# Patient Record
Sex: Female | Born: 1945 | Race: Black or African American | Hispanic: No | Marital: Single | Smoking: Never smoker
Health system: Southern US, Community
[De-identification: ages and names within clinical notes are randomized; demographics above are authoritative.]

## PROBLEM LIST (undated history)

## (undated) DIAGNOSIS — E119 Type 2 diabetes mellitus without complications: Secondary | ICD-10-CM

## (undated) DIAGNOSIS — H409 Unspecified glaucoma: Secondary | ICD-10-CM

## (undated) DIAGNOSIS — I10 Essential (primary) hypertension: Secondary | ICD-10-CM

## (undated) HISTORY — DX: Essential (primary) hypertension: I10

## (undated) HISTORY — DX: Type 2 diabetes mellitus without complications: E11.9

## (undated) HISTORY — DX: Unspecified glaucoma: H40.9

---

## 1998-12-24 ENCOUNTER — Inpatient Hospital Stay (HOSPITAL_COMMUNITY): Admission: EM | Admit: 1998-12-24 | Discharge: 1998-12-29 | Payer: Self-pay | Admitting: Emergency Medicine

## 1998-12-24 ENCOUNTER — Encounter: Payer: Self-pay | Admitting: Cardiology

## 2000-01-26 ENCOUNTER — Ambulatory Visit (HOSPITAL_COMMUNITY): Admission: RE | Admit: 2000-01-26 | Discharge: 2000-01-26 | Payer: Self-pay | Admitting: Cardiology

## 2000-01-26 ENCOUNTER — Encounter: Payer: Self-pay | Admitting: Cardiology

## 2000-02-08 ENCOUNTER — Encounter: Admission: RE | Admit: 2000-02-08 | Discharge: 2000-02-08 | Payer: Self-pay | Admitting: Cardiology

## 2000-02-08 ENCOUNTER — Encounter: Payer: Self-pay | Admitting: Cardiology

## 2001-02-13 ENCOUNTER — Encounter: Admission: RE | Admit: 2001-02-13 | Discharge: 2001-02-13 | Payer: Self-pay | Admitting: Cardiology

## 2001-02-13 ENCOUNTER — Encounter: Payer: Self-pay | Admitting: Cardiology

## 2002-01-09 ENCOUNTER — Emergency Department (HOSPITAL_COMMUNITY): Admission: EM | Admit: 2002-01-09 | Discharge: 2002-01-09 | Payer: Self-pay | Admitting: *Deleted

## 2002-02-16 ENCOUNTER — Encounter: Admission: RE | Admit: 2002-02-16 | Discharge: 2002-02-16 | Payer: Self-pay | Admitting: Cardiology

## 2002-02-16 ENCOUNTER — Encounter: Payer: Self-pay | Admitting: Cardiology

## 2002-05-13 ENCOUNTER — Ambulatory Visit (HOSPITAL_COMMUNITY): Admission: RE | Admit: 2002-05-13 | Discharge: 2002-05-13 | Payer: Self-pay | Admitting: Cardiology

## 2003-04-09 ENCOUNTER — Encounter: Admission: RE | Admit: 2003-04-09 | Discharge: 2003-04-09 | Payer: Self-pay | Admitting: Cardiology

## 2003-04-09 ENCOUNTER — Encounter: Payer: Self-pay | Admitting: Cardiology

## 2004-05-05 ENCOUNTER — Encounter: Admission: RE | Admit: 2004-05-05 | Discharge: 2004-05-05 | Payer: Self-pay | Admitting: Cardiology

## 2004-06-08 ENCOUNTER — Ambulatory Visit (HOSPITAL_COMMUNITY): Admission: RE | Admit: 2004-06-08 | Discharge: 2004-06-08 | Payer: Self-pay | Admitting: *Deleted

## 2004-06-08 ENCOUNTER — Encounter (INDEPENDENT_AMBULATORY_CARE_PROVIDER_SITE_OTHER): Payer: Self-pay | Admitting: Specialist

## 2005-04-12 ENCOUNTER — Ambulatory Visit (HOSPITAL_BASED_OUTPATIENT_CLINIC_OR_DEPARTMENT_OTHER): Admission: RE | Admit: 2005-04-12 | Discharge: 2005-04-12 | Payer: Self-pay | Admitting: Cardiology

## 2005-04-15 ENCOUNTER — Ambulatory Visit: Payer: Self-pay | Admitting: Internal Medicine

## 2005-05-10 ENCOUNTER — Encounter: Admission: RE | Admit: 2005-05-10 | Discharge: 2005-05-10 | Payer: Self-pay | Admitting: Cardiology

## 2006-05-13 ENCOUNTER — Encounter: Admission: RE | Admit: 2006-05-13 | Discharge: 2006-05-13 | Payer: Self-pay | Admitting: Cardiology

## 2007-06-18 ENCOUNTER — Encounter: Admission: RE | Admit: 2007-06-18 | Discharge: 2007-06-18 | Payer: Self-pay | Admitting: Cardiology

## 2008-06-18 ENCOUNTER — Encounter: Admission: RE | Admit: 2008-06-18 | Discharge: 2008-06-18 | Payer: Self-pay | Admitting: Cardiology

## 2009-06-20 ENCOUNTER — Encounter: Admission: RE | Admit: 2009-06-20 | Discharge: 2009-06-20 | Payer: Self-pay | Admitting: Cardiology

## 2010-04-10 ENCOUNTER — Encounter (HOSPITAL_BASED_OUTPATIENT_CLINIC_OR_DEPARTMENT_OTHER): Admission: RE | Admit: 2010-04-10 | Discharge: 2010-07-09 | Payer: Self-pay | Admitting: General Surgery

## 2010-04-12 ENCOUNTER — Ambulatory Visit (HOSPITAL_COMMUNITY): Admission: RE | Admit: 2010-04-12 | Discharge: 2010-04-12 | Payer: Self-pay | Admitting: General Surgery

## 2010-04-17 ENCOUNTER — Ambulatory Visit: Payer: Self-pay | Admitting: Vascular Surgery

## 2010-06-22 ENCOUNTER — Encounter: Admission: RE | Admit: 2010-06-22 | Discharge: 2010-06-22 | Payer: Self-pay | Admitting: Cardiology

## 2010-07-12 ENCOUNTER — Encounter (HOSPITAL_BASED_OUTPATIENT_CLINIC_OR_DEPARTMENT_OTHER)
Admission: RE | Admit: 2010-07-12 | Discharge: 2010-07-21 | Payer: Self-pay | Source: Home / Self Care | Admitting: General Surgery

## 2011-01-02 NOTE — Procedures (Signed)
DUPLEX DEEP VENOUS EXAM - LOWER EXTREMITY   INDICATION:  Questionable venous insufficiency due to ulcers.   HISTORY:  Edema:  Yes.  Trauma/Surgery:  Ulcers on bilateral ankles.  Pain:  No.  PE:  No.  Previous DVT:  No.  Anticoagulants:  No.  Other:  No.   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  There is a perforator noted on the  right ankle level.    _____________________________  Di Kindle. Edilia Bo, M.D.   CB/MEDQ  D:  04/17/2010  T:  04/17/2010  Job:  [id number]

## 2011-01-05 NOTE — Procedures (Signed)
NAME:  Lori Aguirre, Lori Aguirre               ACCOUNT NO.:  [id number]   MEDICAL RECORD NO.:  [id number]          PATIENT TYPE:  OUT   LOCATION:  SLEEP CENTER                 FACILITY:  [facility]   PHYSICIAN:  Clinton D. Maple Hudson, M.D. DATE OF BIRTH:  August 17, 1946   DATE OF STUDY:  04/12/2005                              NOCTURNAL POLYSOMNOGRAM   DATE OF STUDY:  April 12, 2005.   REFERRING PHYSICIAN:  Dr. Donia Guiles.   INDICATION FOR STUDY:  Hypersomnia with sleep apnea. Epworth sleepiness  score 13/24, BMI of 42, weight 257 pounds.   SLEEP ARCHITECTURE:  Short total sleep time 267 minutes with sleep  efficiency 67%. Stage I was 6%, stage II 58%, stages III and IV 27%, REM 10%  of total sleep time. Sleep latency 2 minutes, REM latency 295 minutes, awake  after sleep onset 115 minutes, arousal index 53 which is increased. No  bedtime medication reported.   RESPIRATORY DATA:  Split study protocol. Apnea/hypopnea index (AHI, RDI) 55  obstructive events per hour indicating severe obstructive sleep  apnea/hypopnea syndrome before C-PAP. This included 19 obstructive apneas  and 124 hypopneas before C-PAP. Events were not positional. REM AHI 56. C-  PAP was titrated to 12 CWP, AHI 12.6 per hour before the technician ran out  of time. A ResMed Ultra Mirage full face mask was used with heated  humidifier. Size not specified.   OXYGEN DATA:  Moderate snoring with oxygen desaturation to a nadir of 84%.  After C-PAP control, oxygen saturation held 94-98% on room air.   CARDIAC DATA:  Normal sinus rhythm.   MOVEMENT/PARASOMNIA:  Rare leg jerk. Awoke in the morning with incontinence  and complaint of leg cramp.   IMPRESSION/RECOMMENDATIONS:  1.  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  2.  C-PAP was titrated to 12 centimeter of water pressure, apnea/hypopnea      index 12.6 per hour using a  ResMed      Ultra Mirage full face mask with heated humidifier. Suggest home trial      at 13 or 14 centimeter of water pressure because residual events at 12      centimeter of water pressure.      Clinton D. Maple Hudson, M.D.  Diplomate, Biomedical engineer of Sleep Medicine  Electronically Signed     CDY/MEDQ  D:  04/15/2005 13:59:24  T:  04/16/2005 00:11:22  Job:  [id number]

## 2011-06-19 ENCOUNTER — Other Ambulatory Visit: Payer: Self-pay | Admitting: Cardiology

## 2011-06-19 DIAGNOSIS — Z1231 Encounter for screening mammogram for malignant neoplasm of breast: Secondary | ICD-10-CM

## 2011-07-13 ENCOUNTER — Ambulatory Visit: Payer: Self-pay

## 2011-07-18 ENCOUNTER — Ambulatory Visit
Admission: RE | Admit: 2011-07-18 | Discharge: 2011-07-18 | Disposition: A | Discharge status: Home / Self Care | Payer: Medicare Other | Source: Ambulatory Visit | Attending: Cardiology | Admitting: Cardiology

## 2011-07-18 DIAGNOSIS — Z1231 Encounter for screening mammogram for malignant neoplasm of breast: Secondary | ICD-10-CM

## 2012-07-01 ENCOUNTER — Other Ambulatory Visit: Payer: Self-pay | Admitting: Cardiology

## 2012-07-01 DIAGNOSIS — Z1231 Encounter for screening mammogram for malignant neoplasm of breast: Secondary | ICD-10-CM

## 2012-08-06 ENCOUNTER — Ambulatory Visit: Payer: Medicare Other

## 2015-05-24 ENCOUNTER — Ambulatory Visit
Admission: RE | Admit: 2015-05-24 | Discharge: 2015-05-24 | Disposition: A | Discharge status: Home / Self Care | Payer: Medicare Other | Source: Ambulatory Visit | Attending: Cardiology | Admitting: Cardiology

## 2015-05-24 ENCOUNTER — Other Ambulatory Visit: Payer: Self-pay | Admitting: Cardiology

## 2015-05-24 DIAGNOSIS — Z1231 Encounter for screening mammogram for malignant neoplasm of breast: Secondary | ICD-10-CM

## 2015-05-27 ENCOUNTER — Other Ambulatory Visit: Payer: Self-pay | Admitting: Cardiology

## 2015-05-27 DIAGNOSIS — R928 Other abnormal and inconclusive findings on diagnostic imaging of breast: Secondary | ICD-10-CM

## 2015-05-31 ENCOUNTER — Ambulatory Visit
Admission: RE | Admit: 2015-05-31 | Discharge: 2015-05-31 | Disposition: A | Discharge status: Home / Self Care | Payer: Medicare Other | Source: Ambulatory Visit | Attending: Cardiology | Admitting: Cardiology

## 2015-05-31 DIAGNOSIS — R928 Other abnormal and inconclusive findings on diagnostic imaging of breast: Secondary | ICD-10-CM

## 2016-09-14 ENCOUNTER — Other Ambulatory Visit: Payer: Self-pay | Admitting: Internal Medicine

## 2016-09-14 ENCOUNTER — Encounter (HOSPITAL_BASED_OUTPATIENT_CLINIC_OR_DEPARTMENT_OTHER): Payer: Medicare Other | Attending: Internal Medicine

## 2016-09-14 ENCOUNTER — Ambulatory Visit (HOSPITAL_COMMUNITY)
Admission: RE | Admit: 2016-09-14 | Discharge: 2016-09-14 | Disposition: A | Discharge status: Home / Self Care | Payer: Medicare Other | Source: Ambulatory Visit | Attending: Internal Medicine | Admitting: Internal Medicine

## 2016-09-14 DIAGNOSIS — E1169 Type 2 diabetes mellitus with other specified complication: Principal | ICD-10-CM

## 2016-09-14 DIAGNOSIS — E11621 Type 2 diabetes mellitus with foot ulcer: Secondary | ICD-10-CM

## 2016-09-14 DIAGNOSIS — M869 Osteomyelitis, unspecified: Secondary | ICD-10-CM | POA: Diagnosis not present

## 2016-09-14 DIAGNOSIS — L97509 Non-pressure chronic ulcer of other part of unspecified foot with unspecified severity: Principal | ICD-10-CM

## 2016-09-19 ENCOUNTER — Ambulatory Visit (HOSPITAL_COMMUNITY)
Admission: RE | Admit: 2016-09-19 | Discharge: 2016-09-19 | Disposition: A | Discharge status: Home / Self Care | Payer: Medicare Other | Source: Ambulatory Visit | Attending: Vascular Surgery | Admitting: Vascular Surgery

## 2016-09-19 ENCOUNTER — Other Ambulatory Visit: Payer: Self-pay | Admitting: Internal Medicine

## 2016-09-19 DIAGNOSIS — R938 Abnormal findings on diagnostic imaging of other specified body structures: Secondary | ICD-10-CM | POA: Diagnosis not present

## 2016-09-19 DIAGNOSIS — L97911 Non-pressure chronic ulcer of unspecified part of right lower leg limited to breakdown of skin: Secondary | ICD-10-CM | POA: Diagnosis present

## 2016-09-19 DIAGNOSIS — E11621 Type 2 diabetes mellitus with foot ulcer: Secondary | ICD-10-CM | POA: Diagnosis not present

## 2016-09-19 DIAGNOSIS — E785 Hyperlipidemia, unspecified: Secondary | ICD-10-CM | POA: Diagnosis not present

## 2016-09-19 DIAGNOSIS — I10 Essential (primary) hypertension: Secondary | ICD-10-CM | POA: Diagnosis not present

## 2016-09-20 ENCOUNTER — Encounter (HOSPITAL_BASED_OUTPATIENT_CLINIC_OR_DEPARTMENT_OTHER): Payer: Medicare Other | Attending: Internal Medicine

## 2016-09-20 DIAGNOSIS — E11622 Type 2 diabetes mellitus with other skin ulcer: Secondary | ICD-10-CM | POA: Insufficient documentation

## 2016-09-20 DIAGNOSIS — I10 Essential (primary) hypertension: Secondary | ICD-10-CM | POA: Diagnosis not present

## 2016-09-20 DIAGNOSIS — G4730 Sleep apnea, unspecified: Secondary | ICD-10-CM | POA: Insufficient documentation

## 2016-09-20 DIAGNOSIS — I87321 Chronic venous hypertension (idiopathic) with inflammation of right lower extremity: Secondary | ICD-10-CM | POA: Insufficient documentation

## 2016-09-20 DIAGNOSIS — L97312 Non-pressure chronic ulcer of right ankle with fat layer exposed: Secondary | ICD-10-CM | POA: Insufficient documentation

## 2016-09-27 DIAGNOSIS — I87321 Chronic venous hypertension (idiopathic) with inflammation of right lower extremity: Secondary | ICD-10-CM | POA: Diagnosis not present

## 2016-10-04 DIAGNOSIS — I87321 Chronic venous hypertension (idiopathic) with inflammation of right lower extremity: Secondary | ICD-10-CM | POA: Diagnosis not present

## 2016-10-11 DIAGNOSIS — I87321 Chronic venous hypertension (idiopathic) with inflammation of right lower extremity: Secondary | ICD-10-CM | POA: Diagnosis not present

## 2016-10-18 ENCOUNTER — Encounter (HOSPITAL_BASED_OUTPATIENT_CLINIC_OR_DEPARTMENT_OTHER): Payer: Medicare Other | Attending: Internal Medicine

## 2016-10-18 DIAGNOSIS — G4730 Sleep apnea, unspecified: Secondary | ICD-10-CM | POA: Insufficient documentation

## 2016-10-18 DIAGNOSIS — L97312 Non-pressure chronic ulcer of right ankle with fat layer exposed: Secondary | ICD-10-CM | POA: Insufficient documentation

## 2016-10-18 DIAGNOSIS — I87321 Chronic venous hypertension (idiopathic) with inflammation of right lower extremity: Secondary | ICD-10-CM | POA: Diagnosis not present

## 2016-10-18 DIAGNOSIS — E11622 Type 2 diabetes mellitus with other skin ulcer: Secondary | ICD-10-CM | POA: Diagnosis not present

## 2016-10-18 DIAGNOSIS — L97812 Non-pressure chronic ulcer of other part of right lower leg with fat layer exposed: Secondary | ICD-10-CM | POA: Insufficient documentation

## 2016-10-18 DIAGNOSIS — I10 Essential (primary) hypertension: Secondary | ICD-10-CM | POA: Diagnosis not present

## 2016-10-19 DIAGNOSIS — E11622 Type 2 diabetes mellitus with other skin ulcer: Secondary | ICD-10-CM | POA: Diagnosis not present

## 2016-10-25 DIAGNOSIS — E11622 Type 2 diabetes mellitus with other skin ulcer: Secondary | ICD-10-CM | POA: Diagnosis not present

## 2016-11-01 DIAGNOSIS — E11622 Type 2 diabetes mellitus with other skin ulcer: Secondary | ICD-10-CM | POA: Diagnosis not present

## 2016-11-08 DIAGNOSIS — E11622 Type 2 diabetes mellitus with other skin ulcer: Secondary | ICD-10-CM | POA: Diagnosis not present

## 2016-11-15 DIAGNOSIS — E11622 Type 2 diabetes mellitus with other skin ulcer: Secondary | ICD-10-CM | POA: Diagnosis not present

## 2016-11-22 ENCOUNTER — Encounter (HOSPITAL_BASED_OUTPATIENT_CLINIC_OR_DEPARTMENT_OTHER): Payer: Medicare Other | Attending: Internal Medicine

## 2016-11-22 DIAGNOSIS — I87321 Chronic venous hypertension (idiopathic) with inflammation of right lower extremity: Secondary | ICD-10-CM | POA: Insufficient documentation

## 2016-11-22 DIAGNOSIS — E11622 Type 2 diabetes mellitus with other skin ulcer: Secondary | ICD-10-CM | POA: Diagnosis present

## 2016-11-22 DIAGNOSIS — L97313 Non-pressure chronic ulcer of right ankle with necrosis of muscle: Secondary | ICD-10-CM | POA: Insufficient documentation

## 2016-11-22 DIAGNOSIS — I10 Essential (primary) hypertension: Secondary | ICD-10-CM | POA: Diagnosis not present

## 2016-11-22 DIAGNOSIS — H269 Unspecified cataract: Secondary | ICD-10-CM | POA: Diagnosis not present

## 2016-11-22 DIAGNOSIS — G4730 Sleep apnea, unspecified: Secondary | ICD-10-CM | POA: Insufficient documentation

## 2016-11-29 DIAGNOSIS — E11622 Type 2 diabetes mellitus with other skin ulcer: Secondary | ICD-10-CM | POA: Diagnosis not present

## 2016-12-06 DIAGNOSIS — E11622 Type 2 diabetes mellitus with other skin ulcer: Secondary | ICD-10-CM | POA: Diagnosis not present

## 2016-12-13 DIAGNOSIS — E11622 Type 2 diabetes mellitus with other skin ulcer: Secondary | ICD-10-CM | POA: Diagnosis not present

## 2016-12-20 ENCOUNTER — Encounter (HOSPITAL_BASED_OUTPATIENT_CLINIC_OR_DEPARTMENT_OTHER): Payer: Medicare Other | Attending: Internal Medicine

## 2016-12-20 DIAGNOSIS — I10 Essential (primary) hypertension: Secondary | ICD-10-CM | POA: Diagnosis not present

## 2016-12-20 DIAGNOSIS — I87321 Chronic venous hypertension (idiopathic) with inflammation of right lower extremity: Secondary | ICD-10-CM | POA: Insufficient documentation

## 2016-12-20 DIAGNOSIS — G4730 Sleep apnea, unspecified: Secondary | ICD-10-CM | POA: Insufficient documentation

## 2016-12-20 DIAGNOSIS — L97313 Non-pressure chronic ulcer of right ankle with necrosis of muscle: Secondary | ICD-10-CM | POA: Insufficient documentation

## 2016-12-20 DIAGNOSIS — E11622 Type 2 diabetes mellitus with other skin ulcer: Secondary | ICD-10-CM | POA: Insufficient documentation

## 2016-12-27 DIAGNOSIS — E11622 Type 2 diabetes mellitus with other skin ulcer: Secondary | ICD-10-CM | POA: Diagnosis not present

## 2017-01-03 ENCOUNTER — Other Ambulatory Visit: Payer: Self-pay | Admitting: Internal Medicine

## 2017-01-03 DIAGNOSIS — E11622 Type 2 diabetes mellitus with other skin ulcer: Secondary | ICD-10-CM | POA: Diagnosis not present

## 2017-01-10 DIAGNOSIS — E11622 Type 2 diabetes mellitus with other skin ulcer: Secondary | ICD-10-CM | POA: Diagnosis not present

## 2017-01-17 DIAGNOSIS — E11622 Type 2 diabetes mellitus with other skin ulcer: Secondary | ICD-10-CM | POA: Diagnosis not present

## 2017-01-22 ENCOUNTER — Other Ambulatory Visit: Payer: Self-pay | Admitting: Internal Medicine

## 2017-01-22 ENCOUNTER — Ambulatory Visit (HOSPITAL_COMMUNITY)
Admission: RE | Admit: 2017-01-22 | Discharge: 2017-01-22 | Disposition: A | Discharge status: Home / Self Care | Payer: Medicare Other | Source: Ambulatory Visit | Attending: Vascular Surgery | Admitting: Vascular Surgery

## 2017-01-22 DIAGNOSIS — L97313 Non-pressure chronic ulcer of right ankle with necrosis of muscle: Secondary | ICD-10-CM

## 2017-01-24 ENCOUNTER — Encounter (HOSPITAL_BASED_OUTPATIENT_CLINIC_OR_DEPARTMENT_OTHER): Payer: Medicare Other | Attending: Internal Medicine

## 2017-01-24 DIAGNOSIS — G4730 Sleep apnea, unspecified: Secondary | ICD-10-CM | POA: Insufficient documentation

## 2017-01-24 DIAGNOSIS — E11622 Type 2 diabetes mellitus with other skin ulcer: Secondary | ICD-10-CM | POA: Diagnosis not present

## 2017-01-24 DIAGNOSIS — I10 Essential (primary) hypertension: Secondary | ICD-10-CM | POA: Insufficient documentation

## 2017-01-24 DIAGNOSIS — I87321 Chronic venous hypertension (idiopathic) with inflammation of right lower extremity: Secondary | ICD-10-CM | POA: Diagnosis not present

## 2017-01-24 DIAGNOSIS — L97312 Non-pressure chronic ulcer of right ankle with fat layer exposed: Secondary | ICD-10-CM | POA: Diagnosis not present

## 2017-01-31 DIAGNOSIS — I87321 Chronic venous hypertension (idiopathic) with inflammation of right lower extremity: Secondary | ICD-10-CM | POA: Diagnosis not present

## 2017-02-07 DIAGNOSIS — I87321 Chronic venous hypertension (idiopathic) with inflammation of right lower extremity: Secondary | ICD-10-CM | POA: Diagnosis not present

## 2017-02-14 DIAGNOSIS — I87321 Chronic venous hypertension (idiopathic) with inflammation of right lower extremity: Secondary | ICD-10-CM | POA: Diagnosis not present

## 2017-02-21 ENCOUNTER — Encounter (HOSPITAL_BASED_OUTPATIENT_CLINIC_OR_DEPARTMENT_OTHER): Payer: Medicare Other | Attending: Internal Medicine

## 2017-02-21 DIAGNOSIS — I87331 Chronic venous hypertension (idiopathic) with ulcer and inflammation of right lower extremity: Secondary | ICD-10-CM | POA: Insufficient documentation

## 2017-02-21 DIAGNOSIS — L97312 Non-pressure chronic ulcer of right ankle with fat layer exposed: Secondary | ICD-10-CM | POA: Insufficient documentation

## 2017-02-21 DIAGNOSIS — E11622 Type 2 diabetes mellitus with other skin ulcer: Secondary | ICD-10-CM | POA: Diagnosis not present

## 2017-02-21 DIAGNOSIS — I10 Essential (primary) hypertension: Secondary | ICD-10-CM | POA: Insufficient documentation

## 2017-02-21 DIAGNOSIS — G4730 Sleep apnea, unspecified: Secondary | ICD-10-CM | POA: Diagnosis not present

## 2017-02-28 DIAGNOSIS — E11622 Type 2 diabetes mellitus with other skin ulcer: Secondary | ICD-10-CM | POA: Diagnosis not present

## 2017-03-07 ENCOUNTER — Ambulatory Visit (HOSPITAL_COMMUNITY)
Admission: RE | Admit: 2017-03-07 | Discharge: 2017-03-07 | Disposition: A | Discharge status: Home / Self Care | Payer: Medicare Other | Source: Ambulatory Visit | Attending: Internal Medicine | Admitting: Internal Medicine

## 2017-03-07 ENCOUNTER — Other Ambulatory Visit: Payer: Self-pay | Admitting: Internal Medicine

## 2017-03-07 DIAGNOSIS — M869 Osteomyelitis, unspecified: Secondary | ICD-10-CM

## 2017-03-07 DIAGNOSIS — E11622 Type 2 diabetes mellitus with other skin ulcer: Secondary | ICD-10-CM | POA: Insufficient documentation

## 2017-03-12 ENCOUNTER — Other Ambulatory Visit: Payer: Self-pay | Admitting: Internal Medicine

## 2017-03-12 DIAGNOSIS — L97313 Non-pressure chronic ulcer of right ankle with necrosis of muscle: Secondary | ICD-10-CM

## 2017-03-14 DIAGNOSIS — E11622 Type 2 diabetes mellitus with other skin ulcer: Secondary | ICD-10-CM | POA: Diagnosis not present

## 2017-03-19 ENCOUNTER — Ambulatory Visit (HOSPITAL_COMMUNITY)
Admission: RE | Admit: 2017-03-19 | Discharge: 2017-03-19 | Disposition: A | Discharge status: Home / Self Care | Payer: Medicare Other | Source: Ambulatory Visit | Attending: Internal Medicine | Admitting: Internal Medicine

## 2017-03-19 DIAGNOSIS — L97313 Non-pressure chronic ulcer of right ankle with necrosis of muscle: Secondary | ICD-10-CM | POA: Insufficient documentation

## 2017-03-19 LAB — POCT I-STAT CREATININE: CREATININE: 1 mg/dL (ref 0.44–1.00)

## 2017-03-19 MED ORDER — GADOBENATE DIMEGLUMINE 529 MG/ML IV SOLN
20.0000 mL | Freq: Once | INTRAVENOUS | Status: AC | PRN
  Administered 2017-03-19: 20 mL via INTRAVENOUS

## 2017-03-21 ENCOUNTER — Encounter (HOSPITAL_BASED_OUTPATIENT_CLINIC_OR_DEPARTMENT_OTHER): Payer: Medicare Other | Attending: Internal Medicine

## 2017-03-21 DIAGNOSIS — L97312 Non-pressure chronic ulcer of right ankle with fat layer exposed: Secondary | ICD-10-CM | POA: Insufficient documentation

## 2017-03-21 DIAGNOSIS — E11622 Type 2 diabetes mellitus with other skin ulcer: Secondary | ICD-10-CM | POA: Diagnosis present

## 2017-03-21 DIAGNOSIS — I10 Essential (primary) hypertension: Secondary | ICD-10-CM | POA: Insufficient documentation

## 2017-03-21 DIAGNOSIS — I87321 Chronic venous hypertension (idiopathic) with inflammation of right lower extremity: Secondary | ICD-10-CM | POA: Diagnosis not present

## 2017-03-21 DIAGNOSIS — G4730 Sleep apnea, unspecified: Secondary | ICD-10-CM | POA: Diagnosis not present

## 2017-03-28 DIAGNOSIS — E11622 Type 2 diabetes mellitus with other skin ulcer: Secondary | ICD-10-CM | POA: Diagnosis not present

## 2017-04-04 DIAGNOSIS — E11622 Type 2 diabetes mellitus with other skin ulcer: Secondary | ICD-10-CM | POA: Diagnosis not present

## 2017-04-11 DIAGNOSIS — E11622 Type 2 diabetes mellitus with other skin ulcer: Secondary | ICD-10-CM | POA: Diagnosis not present

## 2017-04-18 DIAGNOSIS — E11622 Type 2 diabetes mellitus with other skin ulcer: Secondary | ICD-10-CM | POA: Diagnosis not present

## 2017-04-25 ENCOUNTER — Encounter (HOSPITAL_BASED_OUTPATIENT_CLINIC_OR_DEPARTMENT_OTHER): Payer: Medicare Other | Attending: Internal Medicine

## 2017-04-25 DIAGNOSIS — E11622 Type 2 diabetes mellitus with other skin ulcer: Secondary | ICD-10-CM | POA: Insufficient documentation

## 2017-04-25 DIAGNOSIS — L97313 Non-pressure chronic ulcer of right ankle with necrosis of muscle: Secondary | ICD-10-CM | POA: Diagnosis not present

## 2017-04-25 DIAGNOSIS — I10 Essential (primary) hypertension: Secondary | ICD-10-CM | POA: Diagnosis not present

## 2017-04-25 DIAGNOSIS — G4730 Sleep apnea, unspecified: Secondary | ICD-10-CM | POA: Diagnosis not present

## 2017-04-25 DIAGNOSIS — I87331 Chronic venous hypertension (idiopathic) with ulcer and inflammation of right lower extremity: Secondary | ICD-10-CM | POA: Insufficient documentation

## 2017-05-02 DIAGNOSIS — E11622 Type 2 diabetes mellitus with other skin ulcer: Secondary | ICD-10-CM | POA: Diagnosis not present

## 2017-05-09 DIAGNOSIS — E11622 Type 2 diabetes mellitus with other skin ulcer: Secondary | ICD-10-CM | POA: Diagnosis not present

## 2017-05-16 DIAGNOSIS — E11622 Type 2 diabetes mellitus with other skin ulcer: Secondary | ICD-10-CM | POA: Diagnosis not present

## 2017-05-30 ENCOUNTER — Encounter (HOSPITAL_BASED_OUTPATIENT_CLINIC_OR_DEPARTMENT_OTHER): Payer: Medicare Other | Attending: Internal Medicine

## 2017-05-30 DIAGNOSIS — I10 Essential (primary) hypertension: Secondary | ICD-10-CM | POA: Insufficient documentation

## 2017-05-30 DIAGNOSIS — G4730 Sleep apnea, unspecified: Secondary | ICD-10-CM | POA: Insufficient documentation

## 2017-05-30 DIAGNOSIS — I87321 Chronic venous hypertension (idiopathic) with inflammation of right lower extremity: Secondary | ICD-10-CM | POA: Insufficient documentation

## 2017-05-30 DIAGNOSIS — L97312 Non-pressure chronic ulcer of right ankle with fat layer exposed: Secondary | ICD-10-CM | POA: Insufficient documentation

## 2017-05-30 DIAGNOSIS — E11622 Type 2 diabetes mellitus with other skin ulcer: Secondary | ICD-10-CM | POA: Insufficient documentation

## 2017-05-31 DIAGNOSIS — I10 Essential (primary) hypertension: Secondary | ICD-10-CM | POA: Diagnosis not present

## 2017-05-31 DIAGNOSIS — G4730 Sleep apnea, unspecified: Secondary | ICD-10-CM | POA: Diagnosis not present

## 2017-05-31 DIAGNOSIS — L97312 Non-pressure chronic ulcer of right ankle with fat layer exposed: Secondary | ICD-10-CM | POA: Diagnosis not present

## 2017-05-31 DIAGNOSIS — E11622 Type 2 diabetes mellitus with other skin ulcer: Secondary | ICD-10-CM | POA: Diagnosis not present

## 2017-05-31 DIAGNOSIS — I87321 Chronic venous hypertension (idiopathic) with inflammation of right lower extremity: Secondary | ICD-10-CM | POA: Diagnosis not present

## 2017-06-06 DIAGNOSIS — E11622 Type 2 diabetes mellitus with other skin ulcer: Secondary | ICD-10-CM | POA: Diagnosis not present

## 2017-06-13 DIAGNOSIS — E11622 Type 2 diabetes mellitus with other skin ulcer: Secondary | ICD-10-CM | POA: Diagnosis not present

## 2017-06-20 ENCOUNTER — Encounter (HOSPITAL_BASED_OUTPATIENT_CLINIC_OR_DEPARTMENT_OTHER): Payer: Medicare Other | Attending: Internal Medicine

## 2017-06-20 DIAGNOSIS — L97312 Non-pressure chronic ulcer of right ankle with fat layer exposed: Secondary | ICD-10-CM | POA: Diagnosis not present

## 2017-06-20 DIAGNOSIS — G4730 Sleep apnea, unspecified: Secondary | ICD-10-CM | POA: Insufficient documentation

## 2017-06-20 DIAGNOSIS — I10 Essential (primary) hypertension: Secondary | ICD-10-CM | POA: Diagnosis not present

## 2017-06-20 DIAGNOSIS — E11622 Type 2 diabetes mellitus with other skin ulcer: Secondary | ICD-10-CM | POA: Diagnosis not present

## 2017-06-20 DIAGNOSIS — I87321 Chronic venous hypertension (idiopathic) with inflammation of right lower extremity: Secondary | ICD-10-CM | POA: Insufficient documentation

## 2017-06-27 DIAGNOSIS — I87321 Chronic venous hypertension (idiopathic) with inflammation of right lower extremity: Secondary | ICD-10-CM | POA: Diagnosis not present

## 2017-07-03 ENCOUNTER — Ambulatory Visit
Admission: RE | Admit: 2017-07-03 | Discharge: 2017-07-03 | Disposition: A | Discharge status: Home / Self Care | Payer: Medicare Other | Source: Ambulatory Visit | Attending: Cardiology | Admitting: Cardiology

## 2017-07-03 ENCOUNTER — Other Ambulatory Visit: Payer: Self-pay | Admitting: Cardiology

## 2017-07-03 DIAGNOSIS — Z1231 Encounter for screening mammogram for malignant neoplasm of breast: Secondary | ICD-10-CM

## 2017-07-04 DIAGNOSIS — I87321 Chronic venous hypertension (idiopathic) with inflammation of right lower extremity: Secondary | ICD-10-CM | POA: Diagnosis not present

## 2017-07-18 DIAGNOSIS — I87321 Chronic venous hypertension (idiopathic) with inflammation of right lower extremity: Secondary | ICD-10-CM | POA: Diagnosis not present

## 2017-07-25 ENCOUNTER — Encounter (HOSPITAL_BASED_OUTPATIENT_CLINIC_OR_DEPARTMENT_OTHER): Payer: Medicare Other | Attending: Internal Medicine

## 2017-07-25 DIAGNOSIS — I87321 Chronic venous hypertension (idiopathic) with inflammation of right lower extremity: Secondary | ICD-10-CM | POA: Insufficient documentation

## 2017-07-25 DIAGNOSIS — G4730 Sleep apnea, unspecified: Secondary | ICD-10-CM | POA: Diagnosis not present

## 2017-07-25 DIAGNOSIS — L97312 Non-pressure chronic ulcer of right ankle with fat layer exposed: Secondary | ICD-10-CM | POA: Insufficient documentation

## 2017-07-25 DIAGNOSIS — I10 Essential (primary) hypertension: Secondary | ICD-10-CM | POA: Insufficient documentation

## 2017-07-25 DIAGNOSIS — E1136 Type 2 diabetes mellitus with diabetic cataract: Secondary | ICD-10-CM | POA: Insufficient documentation

## 2017-07-25 DIAGNOSIS — E11622 Type 2 diabetes mellitus with other skin ulcer: Secondary | ICD-10-CM | POA: Insufficient documentation

## 2017-08-01 DIAGNOSIS — E11622 Type 2 diabetes mellitus with other skin ulcer: Secondary | ICD-10-CM | POA: Diagnosis not present

## 2017-08-08 DIAGNOSIS — E11622 Type 2 diabetes mellitus with other skin ulcer: Secondary | ICD-10-CM | POA: Diagnosis not present

## 2017-08-15 DIAGNOSIS — E11622 Type 2 diabetes mellitus with other skin ulcer: Secondary | ICD-10-CM | POA: Diagnosis not present

## 2017-08-22 ENCOUNTER — Encounter (HOSPITAL_BASED_OUTPATIENT_CLINIC_OR_DEPARTMENT_OTHER): Payer: Medicare Other | Attending: Internal Medicine

## 2017-08-22 DIAGNOSIS — I87331 Chronic venous hypertension (idiopathic) with ulcer and inflammation of right lower extremity: Secondary | ICD-10-CM | POA: Insufficient documentation

## 2017-08-22 DIAGNOSIS — E11622 Type 2 diabetes mellitus with other skin ulcer: Secondary | ICD-10-CM | POA: Insufficient documentation

## 2017-08-22 DIAGNOSIS — L97312 Non-pressure chronic ulcer of right ankle with fat layer exposed: Secondary | ICD-10-CM | POA: Insufficient documentation

## 2017-08-30 DIAGNOSIS — E11622 Type 2 diabetes mellitus with other skin ulcer: Secondary | ICD-10-CM | POA: Diagnosis not present

## 2017-09-12 DIAGNOSIS — E11622 Type 2 diabetes mellitus with other skin ulcer: Secondary | ICD-10-CM | POA: Diagnosis not present

## 2017-09-26 ENCOUNTER — Encounter (HOSPITAL_BASED_OUTPATIENT_CLINIC_OR_DEPARTMENT_OTHER): Payer: Medicare Other | Attending: Internal Medicine

## 2017-09-26 DIAGNOSIS — I10 Essential (primary) hypertension: Secondary | ICD-10-CM | POA: Diagnosis not present

## 2017-09-26 DIAGNOSIS — L97312 Non-pressure chronic ulcer of right ankle with fat layer exposed: Secondary | ICD-10-CM | POA: Insufficient documentation

## 2017-09-26 DIAGNOSIS — E11622 Type 2 diabetes mellitus with other skin ulcer: Secondary | ICD-10-CM | POA: Diagnosis not present

## 2017-09-26 DIAGNOSIS — I87321 Chronic venous hypertension (idiopathic) with inflammation of right lower extremity: Secondary | ICD-10-CM | POA: Diagnosis not present

## 2017-09-26 DIAGNOSIS — G4730 Sleep apnea, unspecified: Secondary | ICD-10-CM | POA: Insufficient documentation

## 2017-10-10 DIAGNOSIS — E11622 Type 2 diabetes mellitus with other skin ulcer: Secondary | ICD-10-CM | POA: Diagnosis not present

## 2017-10-24 ENCOUNTER — Encounter (HOSPITAL_BASED_OUTPATIENT_CLINIC_OR_DEPARTMENT_OTHER): Payer: Medicare Other | Attending: Internal Medicine

## 2017-10-24 DIAGNOSIS — I10 Essential (primary) hypertension: Secondary | ICD-10-CM | POA: Insufficient documentation

## 2017-10-24 DIAGNOSIS — G4730 Sleep apnea, unspecified: Secondary | ICD-10-CM | POA: Diagnosis not present

## 2017-10-24 DIAGNOSIS — E11622 Type 2 diabetes mellitus with other skin ulcer: Secondary | ICD-10-CM | POA: Insufficient documentation

## 2017-10-24 DIAGNOSIS — L97312 Non-pressure chronic ulcer of right ankle with fat layer exposed: Secondary | ICD-10-CM | POA: Diagnosis not present

## 2017-10-24 DIAGNOSIS — I87321 Chronic venous hypertension (idiopathic) with inflammation of right lower extremity: Secondary | ICD-10-CM | POA: Insufficient documentation

## 2017-10-24 DIAGNOSIS — E1136 Type 2 diabetes mellitus with diabetic cataract: Secondary | ICD-10-CM | POA: Diagnosis not present

## 2017-11-07 DIAGNOSIS — E11622 Type 2 diabetes mellitus with other skin ulcer: Secondary | ICD-10-CM | POA: Diagnosis not present

## 2017-11-21 ENCOUNTER — Encounter (HOSPITAL_BASED_OUTPATIENT_CLINIC_OR_DEPARTMENT_OTHER): Payer: Medicare Other | Attending: Internal Medicine

## 2017-11-21 DIAGNOSIS — G4730 Sleep apnea, unspecified: Secondary | ICD-10-CM | POA: Insufficient documentation

## 2017-11-21 DIAGNOSIS — E11622 Type 2 diabetes mellitus with other skin ulcer: Secondary | ICD-10-CM | POA: Insufficient documentation

## 2017-11-21 DIAGNOSIS — I87321 Chronic venous hypertension (idiopathic) with inflammation of right lower extremity: Secondary | ICD-10-CM | POA: Insufficient documentation

## 2017-11-21 DIAGNOSIS — L97312 Non-pressure chronic ulcer of right ankle with fat layer exposed: Secondary | ICD-10-CM | POA: Diagnosis not present

## 2017-11-21 DIAGNOSIS — I10 Essential (primary) hypertension: Secondary | ICD-10-CM | POA: Insufficient documentation

## 2017-12-05 DIAGNOSIS — E11622 Type 2 diabetes mellitus with other skin ulcer: Secondary | ICD-10-CM | POA: Diagnosis not present

## 2017-12-19 ENCOUNTER — Encounter (HOSPITAL_BASED_OUTPATIENT_CLINIC_OR_DEPARTMENT_OTHER): Payer: Medicare Other | Attending: Internal Medicine

## 2017-12-19 DIAGNOSIS — E11622 Type 2 diabetes mellitus with other skin ulcer: Secondary | ICD-10-CM | POA: Diagnosis not present

## 2017-12-19 DIAGNOSIS — I10 Essential (primary) hypertension: Secondary | ICD-10-CM | POA: Diagnosis not present

## 2017-12-19 DIAGNOSIS — G4730 Sleep apnea, unspecified: Secondary | ICD-10-CM | POA: Insufficient documentation

## 2017-12-19 DIAGNOSIS — L97312 Non-pressure chronic ulcer of right ankle with fat layer exposed: Secondary | ICD-10-CM | POA: Diagnosis not present

## 2017-12-19 DIAGNOSIS — I87321 Chronic venous hypertension (idiopathic) with inflammation of right lower extremity: Secondary | ICD-10-CM | POA: Insufficient documentation

## 2018-01-02 DIAGNOSIS — E11622 Type 2 diabetes mellitus with other skin ulcer: Secondary | ICD-10-CM | POA: Diagnosis not present

## 2018-01-16 DIAGNOSIS — E11622 Type 2 diabetes mellitus with other skin ulcer: Secondary | ICD-10-CM | POA: Diagnosis not present

## 2018-01-30 ENCOUNTER — Encounter (HOSPITAL_BASED_OUTPATIENT_CLINIC_OR_DEPARTMENT_OTHER): Payer: Medicare Other | Attending: Internal Medicine

## 2018-01-30 DIAGNOSIS — E11622 Type 2 diabetes mellitus with other skin ulcer: Secondary | ICD-10-CM | POA: Insufficient documentation

## 2018-01-30 DIAGNOSIS — I87331 Chronic venous hypertension (idiopathic) with ulcer and inflammation of right lower extremity: Secondary | ICD-10-CM | POA: Insufficient documentation

## 2018-01-30 DIAGNOSIS — G4730 Sleep apnea, unspecified: Secondary | ICD-10-CM | POA: Diagnosis not present

## 2018-01-30 DIAGNOSIS — I10 Essential (primary) hypertension: Secondary | ICD-10-CM | POA: Diagnosis not present

## 2018-01-30 DIAGNOSIS — L97312 Non-pressure chronic ulcer of right ankle with fat layer exposed: Secondary | ICD-10-CM | POA: Diagnosis not present

## 2018-02-13 DIAGNOSIS — E11622 Type 2 diabetes mellitus with other skin ulcer: Secondary | ICD-10-CM | POA: Diagnosis not present

## 2018-02-27 ENCOUNTER — Encounter (HOSPITAL_BASED_OUTPATIENT_CLINIC_OR_DEPARTMENT_OTHER): Payer: Medicare Other | Attending: Internal Medicine

## 2018-02-27 DIAGNOSIS — I87331 Chronic venous hypertension (idiopathic) with ulcer and inflammation of right lower extremity: Secondary | ICD-10-CM | POA: Insufficient documentation

## 2018-02-27 DIAGNOSIS — I10 Essential (primary) hypertension: Secondary | ICD-10-CM | POA: Insufficient documentation

## 2018-02-27 DIAGNOSIS — E11622 Type 2 diabetes mellitus with other skin ulcer: Secondary | ICD-10-CM | POA: Insufficient documentation

## 2018-02-27 DIAGNOSIS — G4730 Sleep apnea, unspecified: Secondary | ICD-10-CM | POA: Diagnosis not present

## 2018-02-27 DIAGNOSIS — L97412 Non-pressure chronic ulcer of right heel and midfoot with fat layer exposed: Secondary | ICD-10-CM | POA: Insufficient documentation

## 2018-03-20 ENCOUNTER — Encounter (HOSPITAL_BASED_OUTPATIENT_CLINIC_OR_DEPARTMENT_OTHER): Payer: Medicare Other | Attending: Internal Medicine

## 2018-03-20 DIAGNOSIS — E11622 Type 2 diabetes mellitus with other skin ulcer: Secondary | ICD-10-CM | POA: Diagnosis present

## 2018-03-20 DIAGNOSIS — I87331 Chronic venous hypertension (idiopathic) with ulcer and inflammation of right lower extremity: Secondary | ICD-10-CM | POA: Diagnosis not present

## 2018-03-20 DIAGNOSIS — L97312 Non-pressure chronic ulcer of right ankle with fat layer exposed: Secondary | ICD-10-CM | POA: Diagnosis not present

## 2018-04-10 DIAGNOSIS — E11622 Type 2 diabetes mellitus with other skin ulcer: Secondary | ICD-10-CM | POA: Diagnosis not present

## 2018-04-24 ENCOUNTER — Encounter (HOSPITAL_BASED_OUTPATIENT_CLINIC_OR_DEPARTMENT_OTHER): Payer: Medicare Other | Attending: Internal Medicine

## 2018-04-24 DIAGNOSIS — L97312 Non-pressure chronic ulcer of right ankle with fat layer exposed: Secondary | ICD-10-CM | POA: Diagnosis not present

## 2018-04-24 DIAGNOSIS — G4730 Sleep apnea, unspecified: Secondary | ICD-10-CM | POA: Diagnosis not present

## 2018-04-24 DIAGNOSIS — I10 Essential (primary) hypertension: Secondary | ICD-10-CM | POA: Diagnosis not present

## 2018-04-24 DIAGNOSIS — I87331 Chronic venous hypertension (idiopathic) with ulcer and inflammation of right lower extremity: Secondary | ICD-10-CM | POA: Diagnosis not present

## 2018-04-24 DIAGNOSIS — Z7984 Long term (current) use of oral hypoglycemic drugs: Secondary | ICD-10-CM | POA: Diagnosis not present

## 2018-04-24 DIAGNOSIS — E11622 Type 2 diabetes mellitus with other skin ulcer: Secondary | ICD-10-CM | POA: Insufficient documentation

## 2018-05-08 DIAGNOSIS — E11622 Type 2 diabetes mellitus with other skin ulcer: Secondary | ICD-10-CM | POA: Diagnosis not present

## 2018-05-22 ENCOUNTER — Encounter (HOSPITAL_BASED_OUTPATIENT_CLINIC_OR_DEPARTMENT_OTHER): Payer: Medicare Other | Attending: Internal Medicine

## 2018-05-22 DIAGNOSIS — G4730 Sleep apnea, unspecified: Secondary | ICD-10-CM | POA: Diagnosis not present

## 2018-05-22 DIAGNOSIS — E11622 Type 2 diabetes mellitus with other skin ulcer: Secondary | ICD-10-CM | POA: Insufficient documentation

## 2018-05-22 DIAGNOSIS — I10 Essential (primary) hypertension: Secondary | ICD-10-CM | POA: Insufficient documentation

## 2018-05-22 DIAGNOSIS — I87331 Chronic venous hypertension (idiopathic) with ulcer and inflammation of right lower extremity: Secondary | ICD-10-CM | POA: Insufficient documentation

## 2018-05-22 DIAGNOSIS — L97312 Non-pressure chronic ulcer of right ankle with fat layer exposed: Secondary | ICD-10-CM | POA: Insufficient documentation

## 2018-06-05 DIAGNOSIS — E11622 Type 2 diabetes mellitus with other skin ulcer: Secondary | ICD-10-CM | POA: Diagnosis not present

## 2018-06-19 DIAGNOSIS — E11622 Type 2 diabetes mellitus with other skin ulcer: Secondary | ICD-10-CM | POA: Diagnosis not present

## 2018-07-03 ENCOUNTER — Encounter (HOSPITAL_BASED_OUTPATIENT_CLINIC_OR_DEPARTMENT_OTHER): Payer: Medicare Other | Attending: Internal Medicine

## 2018-07-03 DIAGNOSIS — G4730 Sleep apnea, unspecified: Secondary | ICD-10-CM | POA: Insufficient documentation

## 2018-07-03 DIAGNOSIS — I10 Essential (primary) hypertension: Secondary | ICD-10-CM | POA: Insufficient documentation

## 2018-07-03 DIAGNOSIS — I87321 Chronic venous hypertension (idiopathic) with inflammation of right lower extremity: Secondary | ICD-10-CM | POA: Insufficient documentation

## 2018-07-03 DIAGNOSIS — E11621 Type 2 diabetes mellitus with foot ulcer: Secondary | ICD-10-CM | POA: Insufficient documentation

## 2018-07-03 DIAGNOSIS — L97312 Non-pressure chronic ulcer of right ankle with fat layer exposed: Secondary | ICD-10-CM | POA: Insufficient documentation

## 2018-07-24 ENCOUNTER — Encounter (HOSPITAL_BASED_OUTPATIENT_CLINIC_OR_DEPARTMENT_OTHER): Payer: Medicare Other | Attending: Internal Medicine

## 2018-07-24 DIAGNOSIS — G4730 Sleep apnea, unspecified: Secondary | ICD-10-CM | POA: Insufficient documentation

## 2018-07-24 DIAGNOSIS — I10 Essential (primary) hypertension: Secondary | ICD-10-CM | POA: Diagnosis not present

## 2018-07-24 DIAGNOSIS — L97319 Non-pressure chronic ulcer of right ankle with unspecified severity: Secondary | ICD-10-CM | POA: Diagnosis not present

## 2018-07-24 DIAGNOSIS — E11622 Type 2 diabetes mellitus with other skin ulcer: Secondary | ICD-10-CM | POA: Insufficient documentation

## 2018-08-07 DIAGNOSIS — E11622 Type 2 diabetes mellitus with other skin ulcer: Secondary | ICD-10-CM | POA: Diagnosis not present

## 2018-08-21 ENCOUNTER — Encounter (HOSPITAL_BASED_OUTPATIENT_CLINIC_OR_DEPARTMENT_OTHER): Payer: Medicare Other | Attending: Internal Medicine

## 2018-08-21 DIAGNOSIS — E11622 Type 2 diabetes mellitus with other skin ulcer: Secondary | ICD-10-CM | POA: Diagnosis present

## 2018-08-21 DIAGNOSIS — L97313 Non-pressure chronic ulcer of right ankle with necrosis of muscle: Secondary | ICD-10-CM | POA: Diagnosis not present

## 2018-08-21 DIAGNOSIS — G4730 Sleep apnea, unspecified: Secondary | ICD-10-CM | POA: Insufficient documentation

## 2018-08-21 DIAGNOSIS — I87321 Chronic venous hypertension (idiopathic) with inflammation of right lower extremity: Secondary | ICD-10-CM | POA: Diagnosis not present

## 2018-08-21 DIAGNOSIS — I10 Essential (primary) hypertension: Secondary | ICD-10-CM | POA: Diagnosis not present

## 2018-08-28 DIAGNOSIS — E11622 Type 2 diabetes mellitus with other skin ulcer: Secondary | ICD-10-CM | POA: Diagnosis not present

## 2018-09-04 DIAGNOSIS — E11622 Type 2 diabetes mellitus with other skin ulcer: Secondary | ICD-10-CM | POA: Diagnosis not present

## 2018-09-19 DIAGNOSIS — E11622 Type 2 diabetes mellitus with other skin ulcer: Secondary | ICD-10-CM | POA: Diagnosis not present

## 2018-10-10 ENCOUNTER — Encounter (HOSPITAL_BASED_OUTPATIENT_CLINIC_OR_DEPARTMENT_OTHER): Payer: Medicare Other | Attending: Internal Medicine

## 2018-10-10 DIAGNOSIS — E119 Type 2 diabetes mellitus without complications: Secondary | ICD-10-CM | POA: Diagnosis not present

## 2018-10-10 DIAGNOSIS — Z8631 Personal history of diabetic foot ulcer: Secondary | ICD-10-CM | POA: Insufficient documentation

## 2018-10-10 DIAGNOSIS — I87321 Chronic venous hypertension (idiopathic) with inflammation of right lower extremity: Secondary | ICD-10-CM | POA: Diagnosis not present

## 2018-10-10 DIAGNOSIS — I10 Essential (primary) hypertension: Secondary | ICD-10-CM | POA: Insufficient documentation

## 2018-10-10 DIAGNOSIS — G4730 Sleep apnea, unspecified: Secondary | ICD-10-CM | POA: Diagnosis not present

## 2018-10-10 DIAGNOSIS — Z09 Encounter for follow-up examination after completed treatment for conditions other than malignant neoplasm: Secondary | ICD-10-CM | POA: Diagnosis not present

## 2019-01-24 IMAGING — CR DG ANKLE COMPLETE 3+V*R*
3 series · 3 of 3 positions shown · non-contrast
Comparison: None.

CLINICAL DATA: 71-year-old female with diabetic foot ulcer of the
heel with osteomyelitis.

EXAM:
RIGHT ANKLE - COMPLETE 3+ VIEW; RIGHT FOOT COMPLETE - 3+ VIEW

[x ankle ap right]
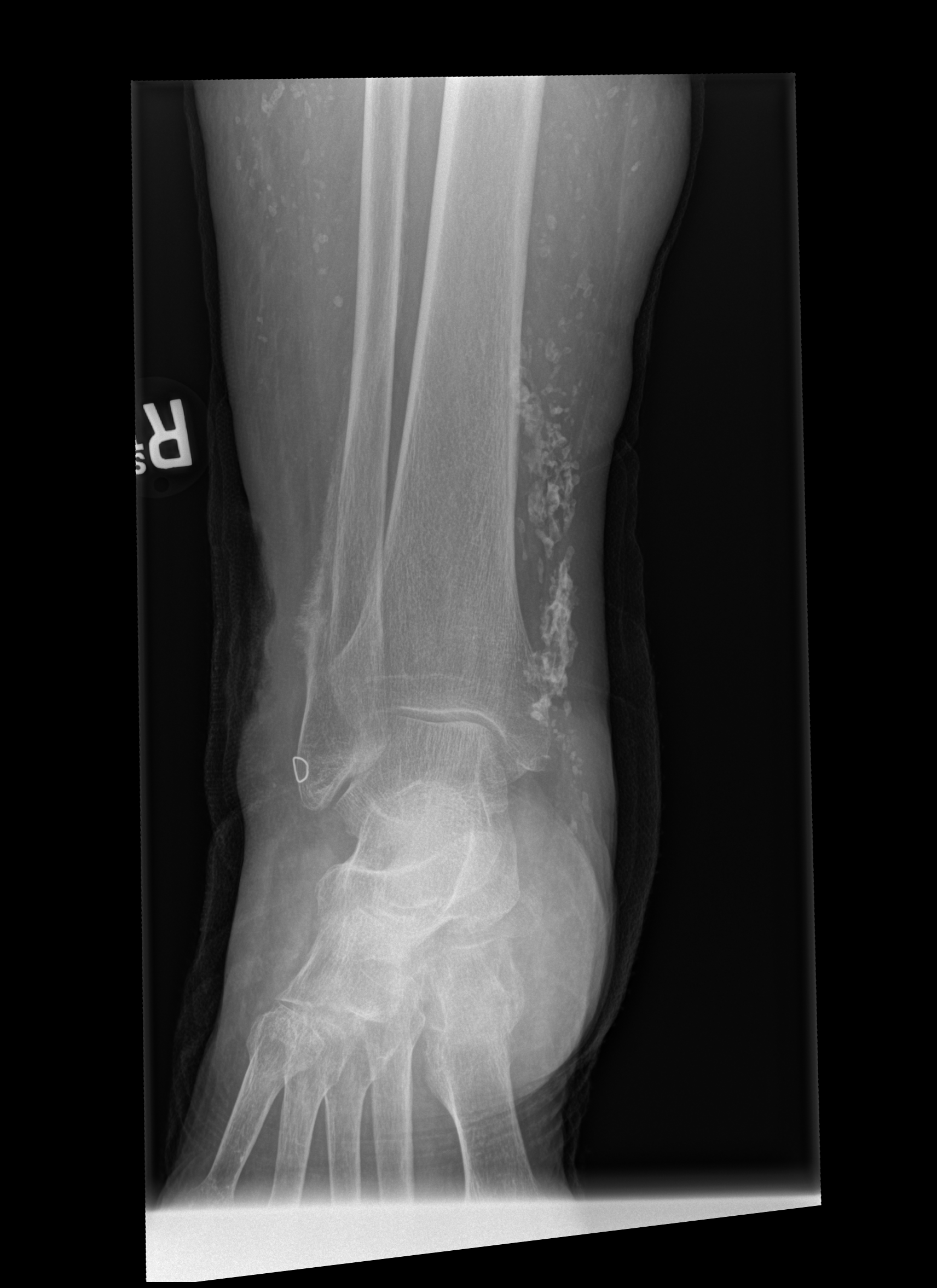

[x ankle obl right]
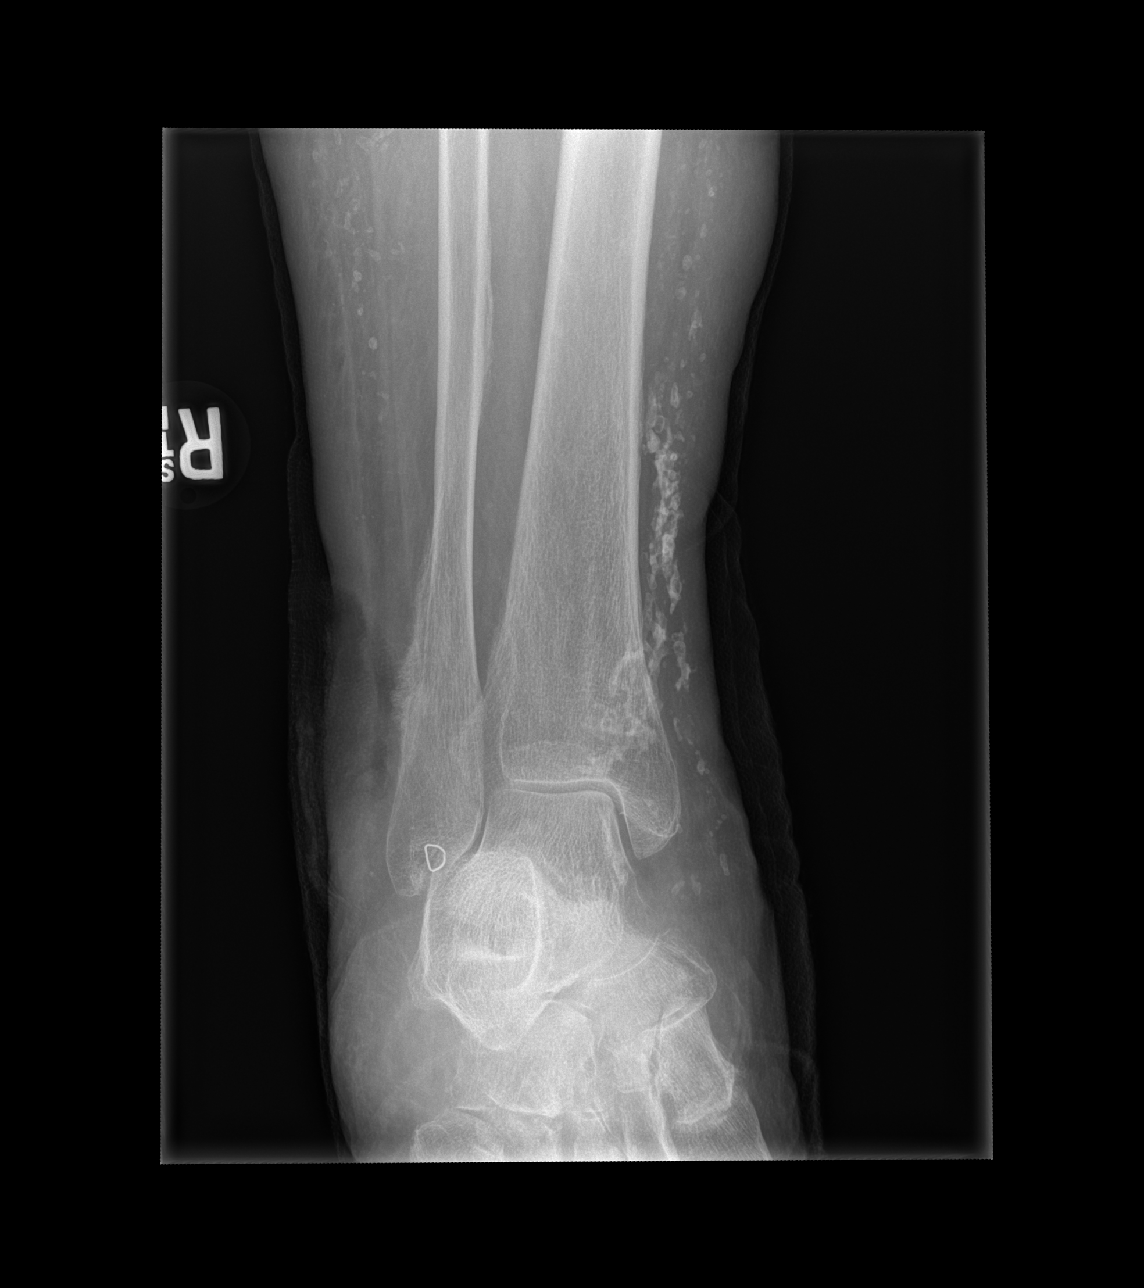

[x ankle lat right]
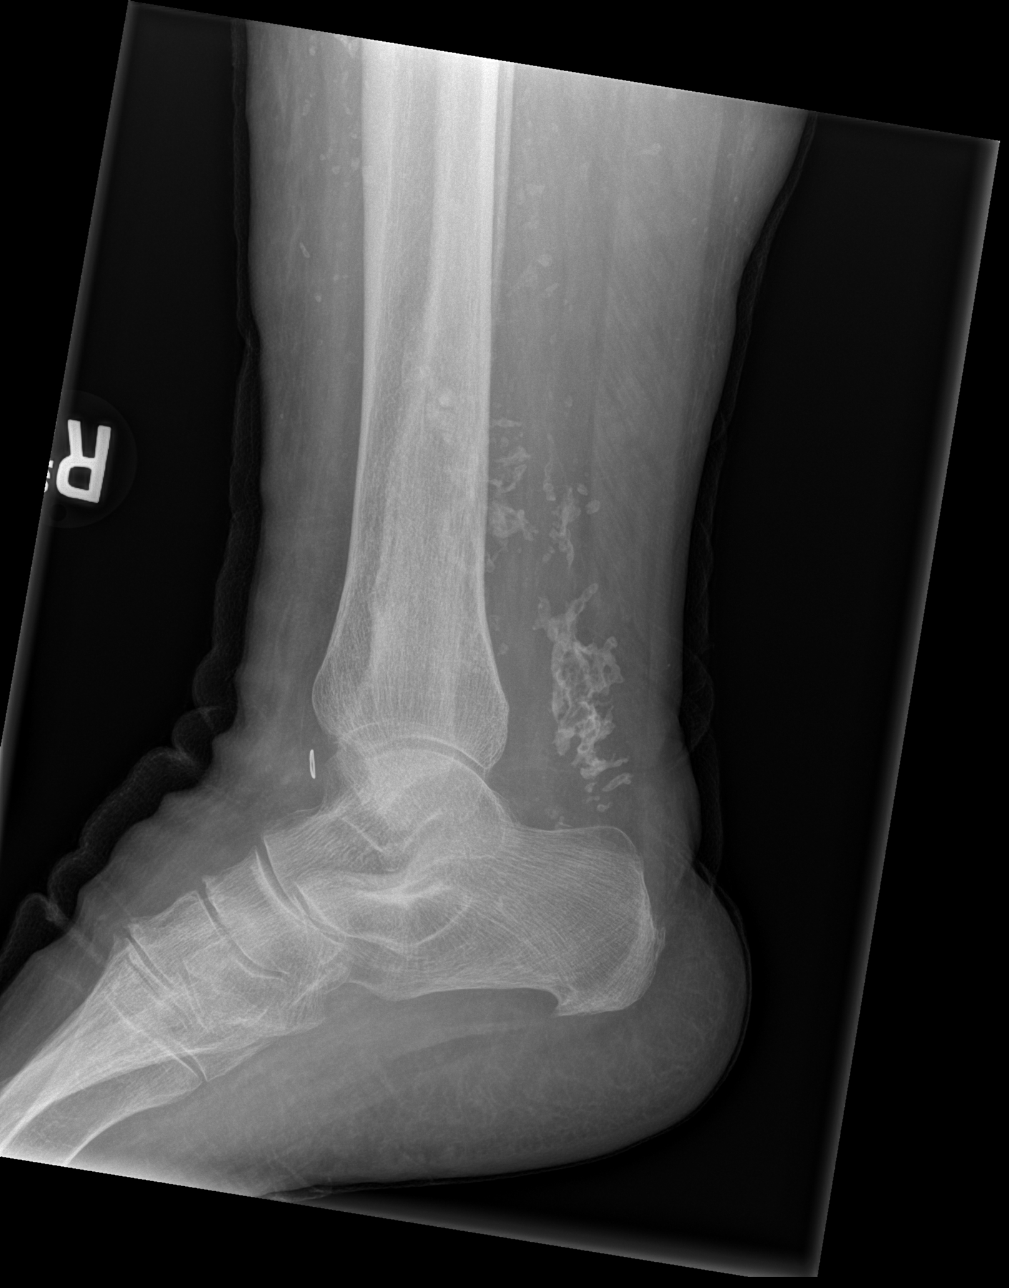

[3 of 3 positions shown; findings below may reference images not displayed]

FINDINGS: There is a fracture of the base of the distal phalanx of the fifth
digit with sclerotic edges, likely representing an old fracture with
nonunion. Clinical correlation is recommended. No other fracture
identified. The bones are osteopenic which limits evaluation for
fracture. There is periosteal thickening of the distal fibula likely
related to an old healed fracture or chronic osteomyelitis. No other
areas of periosteal reaction or bone erosion identified. There is no
dislocation. The ankle mortise is intact. There is amorphous
calcification of the subcutaneous soft tissues of the distal leg and
ankle likely related to chronic venous stasis. There is ulceration
of the skin over the lateral ankle and lateral malleolus. A small
skin ulcer is also noted at the plantar aspect of the foot at the
metatarsal head likely diabetic skin ulcer. No soft tissue gas
identified.
IMPRESSION: No definite acute fracture or dislocation. Old appearing fracture of
the base of the distal phalanx of the fifth metatarsal with
nonunion. Clinical correlation is recommended.

Periosteal elevation of the distal fibula concerning for
osteomyelitis, likely chronic. There is a skin wound over this area.

Small ulceration of the subcutaneous soft tissues of the plantar
aspect of the foot at the head of the metatarsal. No associated bone
erosion or periosteal reaction in this area.

Diffuse amorphous calcification of the subcutaneous soft tissues of
the distal leg and ankle likely related to chronic venous stasis.

## 2019-07-17 IMAGING — DX DG ANKLE COMPLETE 3+V*R*
3 series · 3 of 3 positions shown · non-contrast
Comparison: 09/14/2016

CLINICAL DATA: Nonhealing wound to right lateral malleolus

EXAM:
RIGHT ANKLE - COMPLETE 3+ VIEW

[ankle ap]
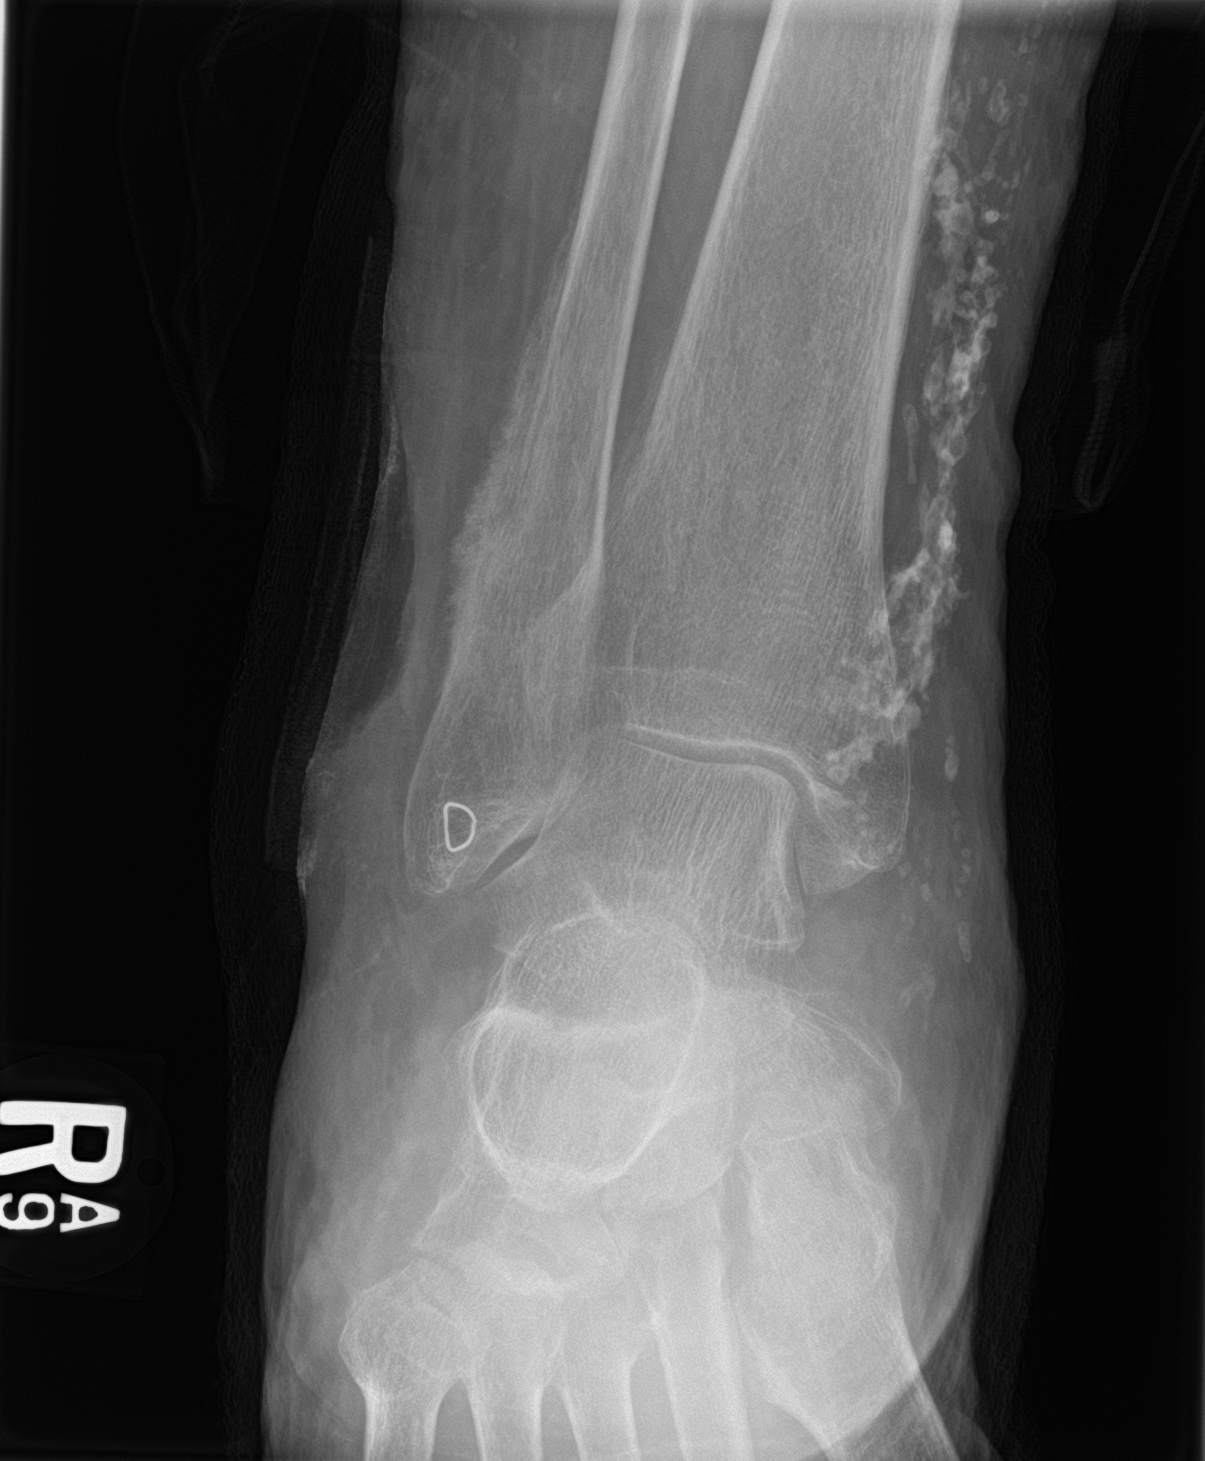

[ankle obl]
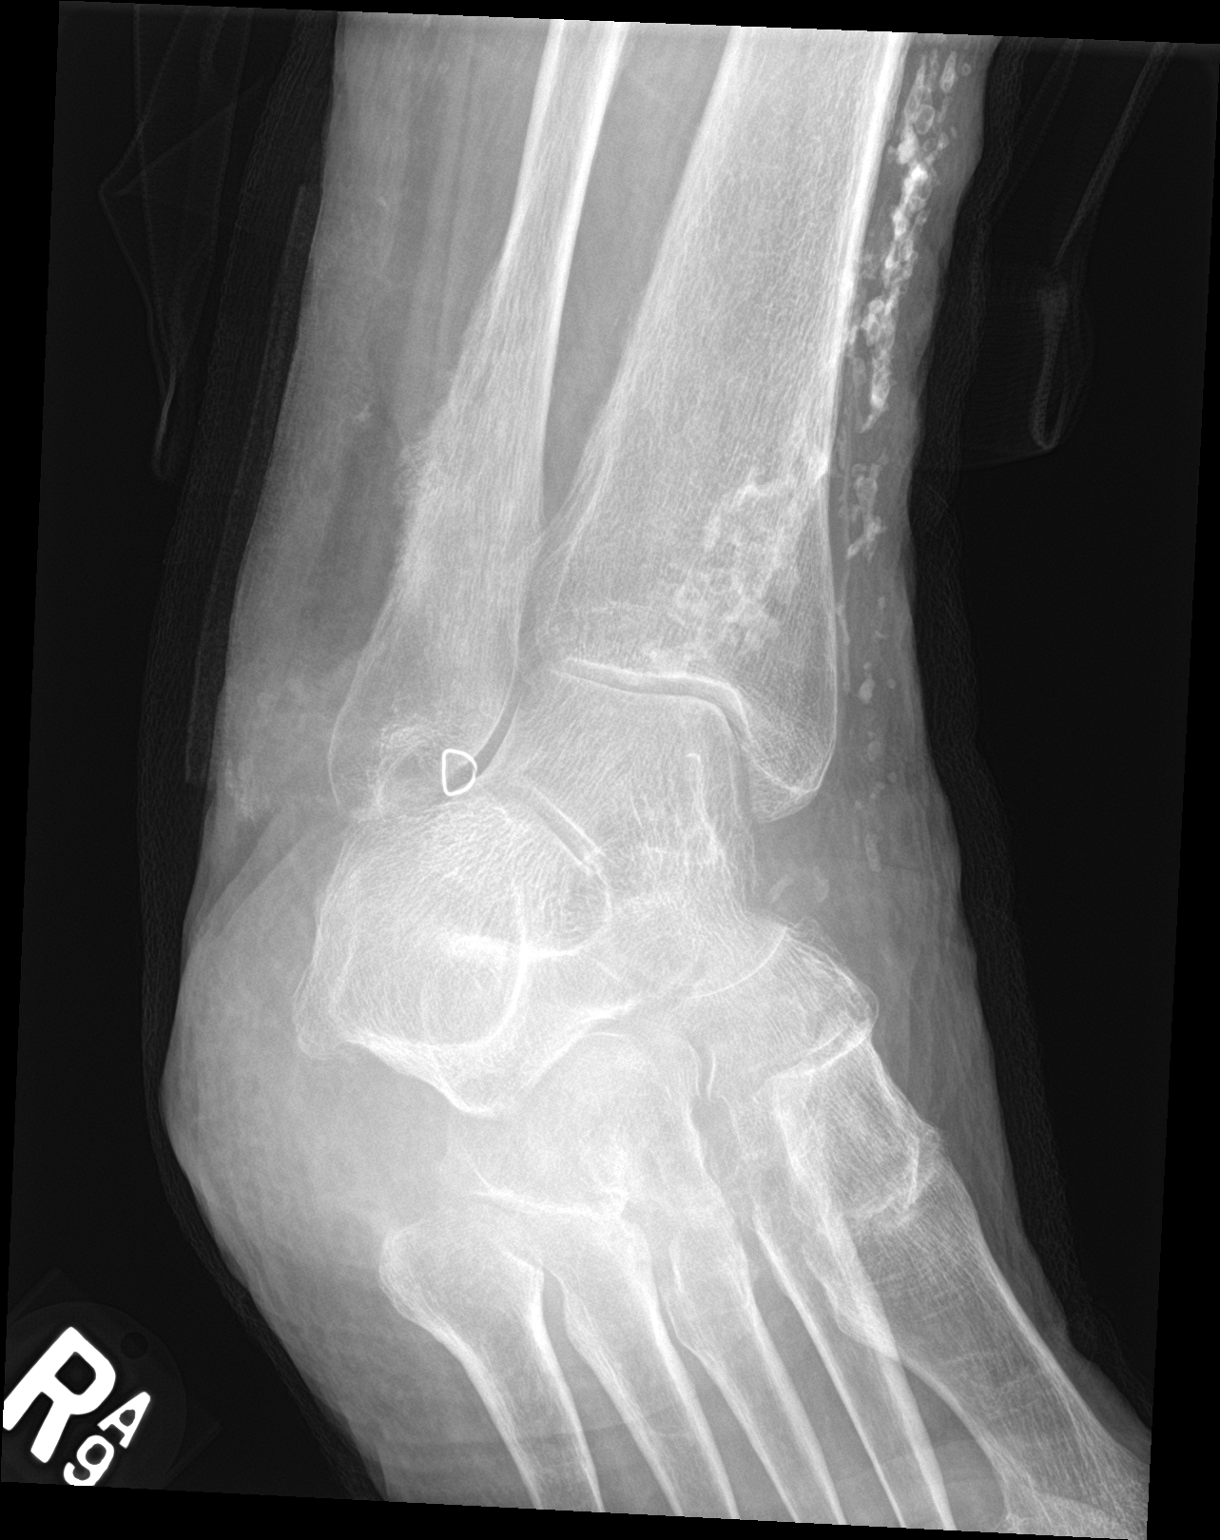

[ankle lat]
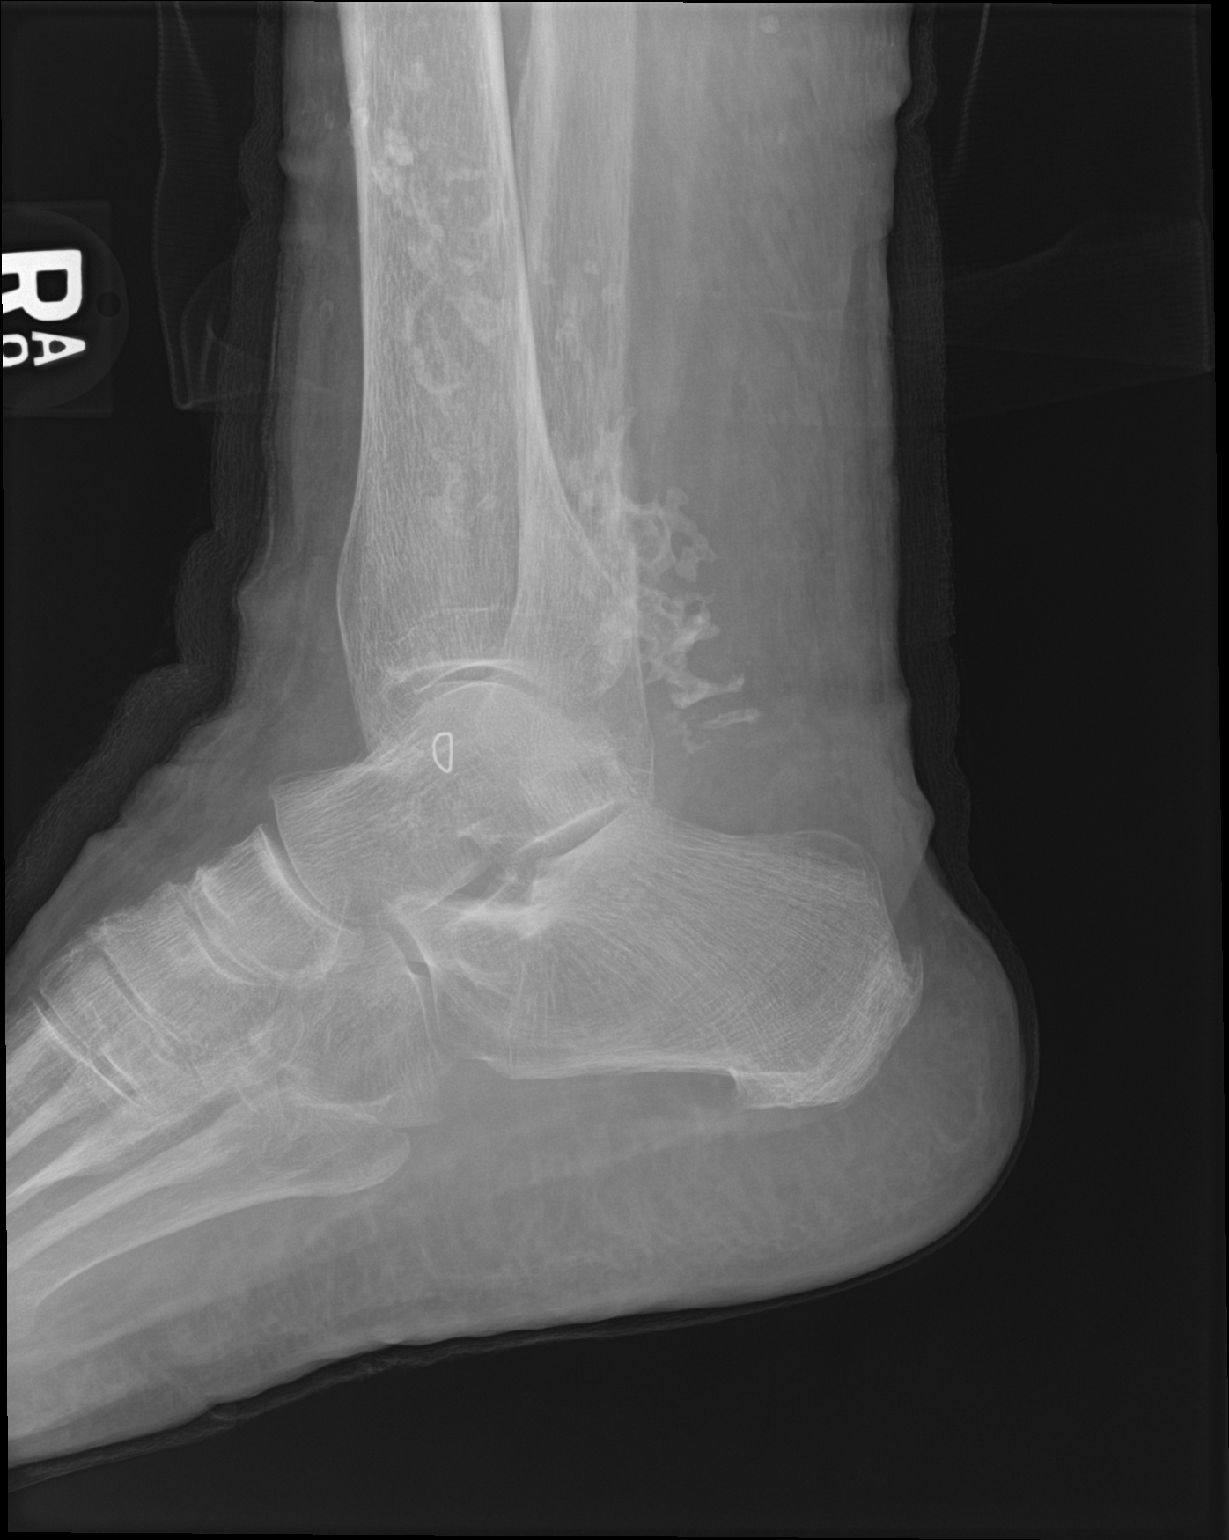

[3 of 3 positions shown; findings below may reference images not displayed]

FINDINGS: No acute fracture.  Ankle joint is normally aligned.

There is periosteal reaction along the lateral aspect of the distal
fibula where there is also some trabecular irregularity. This may be
related to an old fracture in addition to findings suggesting
chronic osteomyelitis. This is similar to the prior study. Along the
posterior aspect of the distal fibula there is subtle cortical
resorption which may reflect acute osteomyelitis. There is no other
bone resorption to suggest other areas of osteomyelitis.

The deep soft tissue wound is seen lateral to the distal fibula,
similar to the prior exam.

There are multiple vascular calcifications along the late most
evident medially, also stable.

Bones are demineralized.
IMPRESSION: 1. No acute fracture or dislocation.
2. Findings consistent with chronic osteomyelitis of the distal
fibula deep to the lateral, chronic soft tissue ulcer. Cortical
resorption along did dorsal margin of the distal fibula suggests a
possible acute osteomyelitis component.
3. No other evidence of osteomyelitis.

## 2021-07-03 HISTORY — PX: CATARACT EXTRACTION: SUR2

## 2021-07-06 ENCOUNTER — Encounter (INDEPENDENT_AMBULATORY_CARE_PROVIDER_SITE_OTHER): Payer: Self-pay

## 2021-07-10 ENCOUNTER — Encounter (INDEPENDENT_AMBULATORY_CARE_PROVIDER_SITE_OTHER): Payer: Self-pay | Admitting: Ophthalmology

## 2021-07-10 ENCOUNTER — Other Ambulatory Visit: Payer: Self-pay

## 2021-07-10 ENCOUNTER — Ambulatory Visit (INDEPENDENT_AMBULATORY_CARE_PROVIDER_SITE_OTHER): Payer: Medicare PPO | Admitting: Ophthalmology

## 2021-07-10 DIAGNOSIS — H43312 Vitreous membranes and strands, left eye: Secondary | ICD-10-CM

## 2021-07-10 DIAGNOSIS — T8522XA Displacement of intraocular lens, initial encounter: Secondary | ICD-10-CM

## 2021-07-10 DIAGNOSIS — H18232 Secondary corneal edema, left eye: Secondary | ICD-10-CM | POA: Diagnosis not present

## 2021-07-10 HISTORY — DX: Secondary corneal edema, left eye: H18.232

## 2021-07-10 NOTE — Assessment & Plan Note (Signed)
OS with three-piece IOL partially dislocated subs luxated superiorly.  Supported by anterior vitreous.  Will explained the patient and family that the left eye will require vitrectomy, clearance of the vitreous debris, and repositioning of the intraocular lens into the left eye with possible suture fixation but not likely required as good anterior capsular support is visualized

## 2021-07-10 NOTE — Patient Instructions (Signed)
Patient continue on Ocuflox 1 drop left eye 3 times daily  Patient to continue on Prolensa 1 drop left eye daily  Patient to continue on Lumigan 1 drop both eyes at bedtime

## 2021-07-10 NOTE — Progress Notes (Signed)
07/10/2021     CHIEF COMPLAINT Patient presents for Retina Evaluation   HISTORY OF PRESENT ILLNESS: Lori Aguirre is a 75 y.o. female who presents to the clinic today for:   HPI     Retina Evaluation           Laterality: both eyes   Onset: 1 week ago   Duration: 1 week   Associated Symptoms: Floaters   Context: distance vision, mid-range vision and near vision   Response to treatment: no improvement         Comments   NP presents for evaluation of dislocated IOL in the left eye, referred by Dr. Harlon Flor.  Pt had cataract surgery 1 week ago in the left eye and now has a dislocated IOL. Pt denies any eye pain.  EyeMeds: Prolensa QD OS Oculfox QID OS Lotemax QID OS Lumigan QHS OU      Last edited by Frederik Pear, COA on 07/10/2021 12:49 PM.        HISTORICAL INFORMATION:   Selected notes from the MEDICAL RECORD NUMBER       CURRENT MEDICATIONS: No current outpatient medications on file. (Ophthalmic Drugs)   No current facility-administered medications for this visit. (Ophthalmic Drugs)   No current outpatient medications on file. (Other)   No current facility-administered medications for this visit. (Other)     ALLERGIES No Known Allergies  PAST MEDICAL HISTORY Past Medical History:  Diagnosis Date   Diabetes mellitus without complication (HCC)    Glaucoma    Hypertension    Past Surgical History:  Procedure Laterality Date   CATARACT EXTRACTION Bilateral 07/03/2021   OD: 2018    FAMILY HISTORY History reviewed. No pertinent family history.  SOCIAL HISTORY Social History   Tobacco Use   Smoking status: Never   Smokeless tobacco: Never         OPHTHALMIC EXAM:  Base Eye Exam     Visual Acuity (ETDRS)       Right Left   Dist [place] 20/20 20/400   Dist ph [place]  20/125         Tonometry (Tonopen, 12:49 PM)       Right Left   Pressure 13 19         Pupils       Pupils Dark Light Shape React APD   Right PERRL  4 3 Round Slow None   Left PERRL 5 5 Irregular Minimal None         Visual Fields (Counting fingers)       Left Right    Full Full         Extraocular Movement       Right Left    Full, Ortho Full, Ortho         Neuro/Psych     Oriented x3: Yes   Mood/Affect: Normal         Dilation     Both eyes: 1.0% Mydriacyl, 2.5% Phenylephrine @ 12:49 PM           Slit Lamp and Fundus Exam     Slit Lamp Exam       Right Left   Lids/Lashes  Normal   Conjunctiva/Sclera  Trace Injection   Cornea  2+ Stromal edema, 2 sutures temp, and1 at 5:30,    Anterior Chamber  Vitreous strands, no corneal touch   Iris  Pigment on endothelium at 6 meridian   Lens Centered posterior chamber intraocular lens Decentered posterior  chamber intraocular lens, inferior haptic seen in visual axis, anterior to the anterior capsule remnant, good capsule remnant superonasal and superior, with optic superior posterior to the capsule rim, central capsule opening   Anterior Vitreous  Vitreous prolapse, Central vitreous floaters, Cells, trace         Fundus Exam       Right Left   Posterior Vitreous Posterior vitreous detachment Cells trace, Central vitreous floaters, Vitreous syneresis, Posterior vitreous detachment   Disc Normal Poor details   C/D Ratio 0.1 Poor details   Macula Normal Normal   Vessels Normal Normal   Periphery Normal Hazy details, no holes            IMAGING AND PROCEDURES  Imaging and Procedures for @TODAY @  OCT, Retina - OU - Both Eyes       Right Eye Quality was good. Scan locations included subfoveal. Central Foveal Thickness: 272. Findings include normal foveal contour.   Notes OS with cloudy media        Color Fundus Photography Optos - OU - Both Eyes       Right Eye Progression has no prior data. Disc findings include normal observations. Macula : normal observations. Vessels : normal observations. Periphery : normal observations.   Left  Eye Progression has no prior data.   Notes OS poor cloudy media from corneal edema and vitreous prolapse anteriorly             ASSESSMENT/PLAN:    ICD-10-CM   1. Vitreous membranes and strands, left eye  H43.312 OCT, Retina - OU - Both Eyes    Color Fundus Photography Optos - OU - Both Eyes    2. Dislocated IOL (intraocular lens), posterior, left  T85.22XA Color Fundus Photography Optos - OU - Both Eyes    3. Secondary corneal edema, left eye  H18.232       Ophthalmic Meds Ordered this visit:  No orders of the defined types were placed in this encounter.       Pre-op completed. Operative consent obtained with pre-op eye drops reviewed with 03-14-1974 and sent via [facility] as needed. Post op instructions reviewed with patient and per patient all questions answered.  Lori Aguirre Lori Aguirre, COA

## 2021-07-10 NOTE — Assessment & Plan Note (Signed)
Stromal edema, no epithelial edema with only modest impairment of visualization anterior segment  Current level of edema will not hamper repair of the pseudophakic condition via vitrectomy, and IOL repositioning nonetheless we will schedule this for next week OS allow for patient to enjoy the upcoming holiday.

## 2021-07-10 NOTE — Progress Notes (Signed)
07/10/2021     CHIEF COMPLAINT Patient presents for  Chief Complaint  Patient presents with   Retina Evaluation      HISTORY OF PRESENT ILLNESS: Lori Aguirre is a 75 y.o. female who presents to the clinic today for:   HPI     Retina Evaluation   In both eyes.  This started 1 week ago.  Duration of 1 week.  Associated Symptoms Floaters.  Context:  distance vision, mid-range vision and near vision.  Response to treatment was no improvement.        Comments   NP presents for evaluation of dislocated IOL in the left eye, referred by Dr. Harlon Flor.  Pt had cataract surgery 1 week ago in the left eye and now has a dislocated IOL. Pt denies any eye pain.  EyeMeds: Prolensa QD OS Oculfox QID OS Lotemax QID OS Lumigan QHS OU      Last edited by Frederik Pear, COA on 07/10/2021 12:49 PM.      Referring physician: Chalmers Guest, MD [address] [place],  [place] [address]  HISTORICAL INFORMATION:   Selected notes from the MEDICAL RECORD NUMBER       CURRENT MEDICATIONS: No current outpatient medications on file. (Ophthalmic Drugs)   No current facility-administered medications for this visit. (Ophthalmic Drugs)   No current outpatient medications on file. (Other)   No current facility-administered medications for this visit. (Other)      REVIEW OF SYSTEMS:    ALLERGIES No Known Allergies  PAST MEDICAL HISTORY Past Medical History:  Diagnosis Date   Diabetes mellitus without complication (HCC)    Glaucoma    Hypertension    Past Surgical History:  Procedure Laterality Date   CATARACT EXTRACTION Bilateral 07/03/2021   OD: 2018    FAMILY HISTORY History reviewed. No pertinent family history.  SOCIAL HISTORY Social History   Tobacco Use   Smoking status: Never   Smokeless tobacco: Never         OPHTHALMIC EXAM:  Base Eye Exam     Visual Acuity (ETDRS)       Right Left   Dist [place] 20/20 20/400   Dist ph [place]  20/125          Tonometry (Tonopen, 12:49 PM)       Right Left   Pressure 13 19         Pupils       Pupils Dark Light Shape React APD   Right PERRL 4 3 Round Slow None   Left PERRL 5 5 Irregular Minimal None         Visual Fields (Counting fingers)       Left Right    Full Full         Extraocular Movement       Right Left    Full, Ortho Full, Ortho         Neuro/Psych     Oriented x3: Yes   Mood/Affect: Normal         Dilation     Both eyes: 1.0% Mydriacyl, 2.5% Phenylephrine @ 12:49 PM           Slit Lamp and Fundus Exam     Slit Lamp Exam       Right Left   Lids/Lashes  Normal   Conjunctiva/Sclera  Trace Injection   Cornea  2+ Stromal edema, 2 sutures temp, and1 at 5:30,    Anterior Chamber  Vitreous strands,  no corneal touch   Iris  Pigment on endothelium at 6 meridian   Lens Centered posterior chamber intraocular lens Decentered posterior chamber intraocular lens, inferior haptic seen in visual axis, anterior to the anterior capsule remnant, good capsule remnant superonasal and superior, with optic superior posterior to the capsule rim, central capsule opening   Anterior Vitreous  Vitreous prolapse, Central vitreous floaters, Cells, trace         Fundus Exam       Right Left   Posterior Vitreous Posterior vitreous detachment Cells trace, Central vitreous floaters, Vitreous syneresis, Posterior vitreous detachment   Disc Normal Poor details   C/D Ratio 0.1 Poor details   Macula Normal Normal   Vessels Normal Normal   Periphery Normal Hazy details, no holes            IMAGING AND PROCEDURES  Imaging and Procedures for 07/10/21  OCT, Retina - OU - Both Eyes       Right Eye Quality was good. Scan locations included subfoveal. Central Foveal Thickness: 272. Findings include normal foveal contour.   Notes OS with cloudy media        Color Fundus Photography Optos - OU - Both Eyes       Right Eye Progression has no  prior data. Disc findings include normal observations. Macula : normal observations. Vessels : normal observations. Periphery : normal observations.   Left Eye Progression has no prior data.   Notes OS poor cloudy media from corneal edema and vitreous prolapse anteriorly             ASSESSMENT/PLAN:  Dislocated IOL (intraocular lens), posterior, left OS with three-piece IOL partially dislocated subs luxated superiorly.  Supported by anterior vitreous.  Will explained the patient and family that the left eye will require vitrectomy, clearance of the vitreous debris, and repositioning of the intraocular lens into the left eye with possible suture fixation but not likely required as good anterior capsular support is visualized  Secondary corneal edema, left eye Stromal edema, no epithelial edema with only modest impairment of visualization anterior segment  Current level of edema will not hamper repair of the pseudophakic condition via vitrectomy, and IOL repositioning nonetheless we will schedule this for next week OS allow for patient to enjoy the upcoming holiday.      ICD-10-CM   1. Vitreous membranes and strands, left eye  H43.312 Color Fundus Photography Optos - OU - Both Eyes    2. Dislocated IOL (intraocular lens), posterior, left  T85.22XA     3. Secondary corneal edema, left eye  H18.232       1.  Risk benefits of surgical intervention will be undertaken.  Discussed today in the office.  Explained the patient that under local anesthesia that the surgery will take something on the order of 30 minutes to complete  2.  Patient is to continue on current plan of topical eye medications left eye.  We will proceed with vitrectomy and intraocular lens repositioning the left eye with only a very small chance of needing suture fixation to the iris.  More likely sulcus fixation with a 3 piece IOL in place will be adequate.  3.  The patient is on any eye medications in the right  eye she is to continue them as prescribed by Dr. Harlon Flor  In the meantime I have asked the patient not to compress mash or rub the eye.  Ophthalmic Meds Ordered this visit:  No orders of the defined types  were placed in this encounter.      Return ,, [facility], for Schedule vitrectomy, IOL repositioning left eye, OS.  Patient Instructions  Patient continue on Ocuflox 1 drop left eye 3 times daily  Patient to continue on Prolensa 1 drop left eye daily  Patient to continue on Lumigan 1 drop both eyes at bedtime   Explained the diagnoses, plan, and follow up with the patient and they expressed understanding.  Patient expressed understanding of the importance of proper follow up care.   Alford Highland Britiany Silbernagel M.D. Diseases & Surgery of the Retina and Vitreous [facility] 07/10/21     Abbreviations: M myopia (nearsighted); A astigmatism; H hyperopia (farsighted); P presbyopia; Mrx spectacle prescription;  CTL contact lenses; OD right eye; OS left eye; OU both eyes  XT exotropia; ET esotropia; PEK punctate epithelial keratitis; PEE punctate epithelial erosions; DES dry eye syndrome; MGD meibomian gland dysfunction; ATs artificial tears; PFAT's preservative free artificial tears; NSC nuclear sclerotic cataract; PSC posterior subcapsular cataract; ERM epi-retinal membrane; PVD posterior vitreous detachment; RD retinal detachment; DM diabetes mellitus; DR diabetic retinopathy; NPDR non-proliferative diabetic retinopathy; PDR proliferative diabetic retinopathy; CSME clinically significant macular edema; DME diabetic macular edema; dbh dot blot hemorrhages; CWS cotton wool spot; POAG primary open angle glaucoma; C/D cup-to-disc ratio; HVF humphrey visual field; GVF goldmann visual field; OCT optical coherence tomography; IOP intraocular pressure; BRVO Branch retinal vein occlusion; CRVO central retinal vein occlusion; CRAO central retinal artery occlusion; BRAO branch  retinal artery occlusion; RT retinal tear; SB scleral buckle; PPV pars plana vitrectomy; VH Vitreous hemorrhage; PRP panretinal laser photocoagulation; IVK intravitreal kenalog; VMT vitreomacular traction; MH Macular hole;  NVD neovascularization of the disc; NVE neovascularization elsewhere; AREDS age related eye disease study; ARMD age related macular degeneration; POAG primary open angle glaucoma; EBMD epithelial/anterior basement membrane dystrophy; ACIOL anterior chamber intraocular lens; IOL intraocular lens; PCIOL posterior chamber intraocular lens; Phaco/IOL phacoemulsification with intraocular lens placement; PRK photorefractive keratectomy; LASIK laser assisted in situ keratomileusis; HTN hypertension; DM diabetes mellitus; COPD chronic obstructive pulmonary disease

## 2021-07-27 ENCOUNTER — Encounter (AMBULATORY_SURGERY_CENTER): Payer: Medicare PPO | Admitting: Ophthalmology

## 2021-07-27 ENCOUNTER — Encounter (INDEPENDENT_AMBULATORY_CARE_PROVIDER_SITE_OTHER): Payer: Medicare PPO | Admitting: Ophthalmology

## 2021-07-27 DIAGNOSIS — H59022 Cataract (lens) fragments in eye following cataract surgery, left eye: Secondary | ICD-10-CM | POA: Diagnosis not present

## 2021-07-27 DIAGNOSIS — T8522XA Displacement of intraocular lens, initial encounter: Secondary | ICD-10-CM

## 2021-07-27 DIAGNOSIS — H43312 Vitreous membranes and strands, left eye: Secondary | ICD-10-CM

## 2021-07-28 ENCOUNTER — Encounter (INDEPENDENT_AMBULATORY_CARE_PROVIDER_SITE_OTHER): Payer: Self-pay | Admitting: Ophthalmology

## 2021-07-28 ENCOUNTER — Ambulatory Visit (INDEPENDENT_AMBULATORY_CARE_PROVIDER_SITE_OTHER): Payer: Medicare PPO | Admitting: Ophthalmology

## 2021-07-28 DIAGNOSIS — T8522XA Displacement of intraocular lens, initial encounter: Secondary | ICD-10-CM

## 2021-07-28 DIAGNOSIS — H59022 Cataract (lens) fragments in eye following cataract surgery, left eye: Secondary | ICD-10-CM

## 2021-07-28 MED ORDER — OFLOXACIN 0.3 % OP SOLN: 1.0000 [drp] | OPHTHALMIC | 0 refills | Status: AC

## 2021-07-28 MED ORDER — LOTEPREDNOL ETABONATE 0.5 % OP SUSP: 1.0000 [drp] | Freq: Four times a day (QID) | OPHTHALMIC | 1 refills | Status: AC

## 2021-07-28 NOTE — Progress Notes (Signed)
07/28/2021     CHIEF COMPLAINT Patient presents for  Chief Complaint  Patient presents with   Post-op Follow-up      HISTORY OF PRESENT ILLNESS: Lori Aguirre is a 75 y.o. female who presents to the clinic today for:   HPI   Postop day #1 status post vitrectomy for vitreous membranes and strands, retained lens fragments, repositioning of intraocular lens which was partially dislocated to the anterior vitreous, with placement into the sulcus.  Also with lysis of posterior synechia to the lens capsule temporally.  No discomfort last night.   Last edited by Edmon Crape, MD on 07/28/2021  8:20 AM.      Referring physician: No referring provider defined for this encounter.  HISTORICAL INFORMATION:   Selected notes from the MEDICAL RECORD NUMBER       CURRENT MEDICATIONS: Current Outpatient Medications (Ophthalmic Drugs)  Medication Sig   loteprednol (LOTEMAX) 0.5 % ophthalmic suspension Place 1 drop into the left eye 4 (four) times daily.   ofloxacin (OCUFLOX) 0.3 % ophthalmic solution Place 1 drop into the left eye every 4 (four) hours for 10 days.   No current facility-administered medications for this visit. (Ophthalmic Drugs)   No current outpatient medications on file. (Other)   No current facility-administered medications for this visit. (Other)      REVIEW OF SYSTEMS: ROS   Negative for: Constitutional, Gastrointestinal, Neurological, Skin, Genitourinary, Musculoskeletal, HENT, Endocrine, Cardiovascular, Eyes, Respiratory, Psychiatric, Allergic/Imm, Heme/Lymph Last edited by Edmon Crape, MD on 07/28/2021  8:20 AM.       ALLERGIES No Known Allergies  PAST MEDICAL HISTORY Past Medical History:  Diagnosis Date   Diabetes mellitus without complication (HCC)    Glaucoma    Hypertension    Past Surgical History:  Procedure Laterality Date   CATARACT EXTRACTION Bilateral 07/03/2021   OD: 2018    FAMILY HISTORY History reviewed. No pertinent  family history.  SOCIAL HISTORY Social History   Tobacco Use   Smoking status: Never   Smokeless tobacco: Never         OPHTHALMIC EXAM:  Base Eye Exam     Visual Acuity (ETDRS)       Right Left   Dist [place]  20/100 +1         Tonometry (Tonopen, 8:30 AM)       Right Left   Pressure  26         Neuro/Psych     Oriented x3: Yes   Mood/Affect: Normal           Slit Lamp and Fundus Exam     External Exam       Right Left   External Normal Normal         Slit Lamp Exam       Right Left   Lids/Lashes  Normal   Conjunctiva/Sclera  Trace Injection   Cornea  Clear   Anterior Chamber  Deep and quiet   Iris  Pigment on endothelium at 6 meridian   Lens Centered posterior chamber intraocular lens Centered posterior chamber intraocular lens   Anterior Vitreous  Normal         Fundus Exam       Right Left   Posterior Vitreous  Clear avitric   Disc  Normal   Macula  Normal   Vessels  Normal   Periphery  Hazy details, no holes            IMAGING AND  PROCEDURES  Imaging and Procedures for 07/28/21           ASSESSMENT/PLAN:  Dislocated IOL (intraocular lens), posterior, left Postop day #1 looks great, IOL nicely centered.  Cataract (lens) fragments in eye following cataract surgery, left eye Looks great posteriorly, no residual lens fragments seen      ICD-10-CM   1. Dislocated IOL (intraocular lens), posterior, left  T85.22XA     2. Cataract (lens) fragments in eye following cataract surgery, left eye  H59.022       1.  Postop day #1 looks great with intraocular is well centered.  Vitreous strands retained lens fragments all cleared.  2.  3.  Ophthalmic Meds Ordered this visit:  Meds ordered this encounter  Medications   ofloxacin (OCUFLOX) 0.3 % ophthalmic solution    Sig: Place 1 drop into the left eye every 4 (four) hours for 10 days.    Dispense:  5 mL    Refill:  0   loteprednol (LOTEMAX) 0.5 % ophthalmic  suspension    Sig: Place 1 drop into the left eye 4 (four) times daily.    Dispense:  5 mL    Refill:  1       Return in about 4 days (around 08/01/2021) for dilate, OS, POST OP.  There are no Patient Instructions on file for this visit.   Explained the diagnoses, plan, and follow up with the patient and they expressed understanding.  Patient expressed understanding of the importance of proper follow up care.   Alford Highland Scarlettrose Costilow M.D. Diseases & Surgery of the Retina and Vitreous [facility] 07/28/21     Abbreviations: M myopia (nearsighted); A astigmatism; H hyperopia (farsighted); P presbyopia; Mrx spectacle prescription;  CTL contact lenses; OD right eye; OS left eye; OU both eyes  XT exotropia; ET esotropia; PEK punctate epithelial keratitis; PEE punctate epithelial erosions; DES dry eye syndrome; MGD meibomian gland dysfunction; ATs artificial tears; PFAT's preservative free artificial tears; NSC nuclear sclerotic cataract; PSC posterior subcapsular cataract; ERM epi-retinal membrane; PVD posterior vitreous detachment; RD retinal detachment; DM diabetes mellitus; DR diabetic retinopathy; NPDR non-proliferative diabetic retinopathy; PDR proliferative diabetic retinopathy; CSME clinically significant macular edema; DME diabetic macular edema; dbh dot blot hemorrhages; CWS cotton wool spot; POAG primary open angle glaucoma; C/D cup-to-disc ratio; HVF humphrey visual field; GVF goldmann visual field; OCT optical coherence tomography; IOP intraocular pressure; BRVO Branch retinal vein occlusion; CRVO central retinal vein occlusion; CRAO central retinal artery occlusion; BRAO branch retinal artery occlusion; RT retinal tear; SB scleral buckle; PPV pars plana vitrectomy; VH Vitreous hemorrhage; PRP panretinal laser photocoagulation; IVK intravitreal kenalog; VMT vitreomacular traction; MH Macular hole;  NVD neovascularization of the disc; NVE neovascularization elsewhere; AREDS age  related eye disease study; ARMD age related macular degeneration; POAG primary open angle glaucoma; EBMD epithelial/anterior basement membrane dystrophy; ACIOL anterior chamber intraocular lens; IOL intraocular lens; PCIOL posterior chamber intraocular lens; Phaco/IOL phacoemulsification with intraocular lens placement; PRK photorefractive keratectomy; LASIK laser assisted in situ keratomileusis; HTN hypertension; DM diabetes mellitus; COPD chronic obstructive pulmonary disease

## 2021-07-28 NOTE — Assessment & Plan Note (Signed)
Postop day #1 looks great, IOL nicely centered.

## 2021-07-28 NOTE — Assessment & Plan Note (Signed)
Looks great posteriorly, no residual lens fragments seen

## 2021-07-31 ENCOUNTER — Encounter (INDEPENDENT_AMBULATORY_CARE_PROVIDER_SITE_OTHER): Payer: Self-pay

## 2021-07-31 ENCOUNTER — Telehealth (INDEPENDENT_AMBULATORY_CARE_PROVIDER_SITE_OTHER): Payer: Self-pay

## 2021-08-01 ENCOUNTER — Ambulatory Visit (INDEPENDENT_AMBULATORY_CARE_PROVIDER_SITE_OTHER): Payer: Medicare PPO | Admitting: Ophthalmology

## 2021-08-01 ENCOUNTER — Other Ambulatory Visit: Payer: Self-pay

## 2021-08-01 ENCOUNTER — Encounter (INDEPENDENT_AMBULATORY_CARE_PROVIDER_SITE_OTHER): Payer: Self-pay | Admitting: Ophthalmology

## 2021-08-01 DIAGNOSIS — T8522XA Displacement of intraocular lens, initial encounter: Secondary | ICD-10-CM

## 2021-08-01 DIAGNOSIS — H18232 Secondary corneal edema, left eye: Secondary | ICD-10-CM

## 2021-08-01 DIAGNOSIS — H59022 Cataract (lens) fragments in eye following cataract surgery, left eye: Secondary | ICD-10-CM

## 2021-08-01 NOTE — Assessment & Plan Note (Signed)
Condition resolved with vitrectomy

## 2021-08-01 NOTE — Assessment & Plan Note (Signed)
Slightly posterior dislocated and decentered intraocular lens now well centered in the sulcus

## 2021-08-01 NOTE — Patient Instructions (Signed)
Ofloxacin  4 times daily to the operative eye  Lotemax SM 1 drop to the operative eye 4 times daily  Patient instructed not to refill the medications and use them for maximum of 3 weeks.  Patient instructed do not rub the eye.  Patient has the option to use the patch at night.

## 2021-08-01 NOTE — Progress Notes (Signed)
08/01/2021     CHIEF COMPLAINT Patient presents for  Chief Complaint  Patient presents with   Retina Follow Up      HISTORY OF PRESENT ILLNESS: Lori Aguirre is a 75 y.o. female who presents to the clinic today for:   HPI     Retina Follow Up           Diagnosis: Other   Laterality: left eye   Onset: 5 days ago   Severity: mild   Duration: 5 days   Course: gradually improving         Comments   5 day PO OS sx 07/28/2021. Postop day #5 status post vitrectomy for vitreous membranes and strands. Pt states vision has improved from what it was before the surgery but it is not completely clear, "it is like looking through binoculars that are not focused." Complains of a "sand or stitches" feeling after instilling eye drops. Pt is using ofloxacin QID OS. Pt states the "other drop" is going to be delivered today.          Last edited by Nelva Nay on 08/01/2021  1:08 PM.      Referring physician: Chalmers Guest, MD [address] [place],  [place] [address]  HISTORICAL INFORMATION:   Selected notes from the MEDICAL RECORD NUMBER       CURRENT MEDICATIONS: Current Outpatient Medications (Ophthalmic Drugs)  Medication Sig   loteprednol (LOTEMAX) 0.5 % ophthalmic suspension Place 1 drop into the left eye 4 (four) times daily.   ofloxacin (OCUFLOX) 0.3 % ophthalmic solution Place 1 drop into the left eye every 4 (four) hours for 10 days.   No current facility-administered medications for this visit. (Ophthalmic Drugs)   No current outpatient medications on file. (Other)   No current facility-administered medications for this visit. (Other)      REVIEW OF SYSTEMS:    ALLERGIES No Known Allergies  PAST MEDICAL HISTORY Past Medical History:  Diagnosis Date   Diabetes mellitus without complication (HCC)    Glaucoma    Hypertension    Past Surgical History:  Procedure Laterality Date   CATARACT EXTRACTION Bilateral 07/03/2021    OD: 2018    FAMILY HISTORY History reviewed. No pertinent family history.  SOCIAL HISTORY Social History   Tobacco Use   Smoking status: Never   Smokeless tobacco: Never         OPHTHALMIC EXAM:  Base Eye Exam     Visual Acuity (ETDRS)       Right Left   Dist [place] 20/20 -2 20/80 -1   Dist ph [place]  20/40 -1  Pt states left eye when looking at chart is double         Tonometry (Tonopen, 1:12 PM)       Right Left   Pressure 20 20  Checked IOP twice.        Pupils       Dark Light Shape React APD   Right 4 3 Round Brisk None   Left 5 5 Irregular  None         Visual Fields       Left Right    Full Full         Extraocular Movement       Right Left    Full Full         Neuro/Psych     Oriented x3: Yes   Mood/Affect: Normal  Dilation     Left eye: 1.0% Mydriacyl, 2.5% Phenylephrine @ 1:12 PM           Slit Lamp and Fundus Exam     External Exam       Right Left   External Normal Normal         Slit Lamp Exam       Right Left   Lids/Lashes  Normal   Conjunctiva/Sclera  Trace Injection   Cornea  Clear, well secured   Anterior Chamber  Deep and quiet   Iris  Round   Lens Centered posterior chamber intraocular lens Centered posterior chamber intraocular lens, stable no pseudophacodonesis   Anterior Vitreous  Clear avitric         Fundus Exam       Right Left   Posterior Vitreous  Clear avitric   Disc  Normal, pink   C/D Ratio  0.25   Macula  Normal   Vessels  Normal   Periphery  Clear, no holes or tears            IMAGING AND PROCEDURES  Imaging and Procedures for 08/01/21           ASSESSMENT/PLAN:  Cataract (lens) fragments in eye following cataract surgery, left eye Condition resolved with vitrectomy  Dislocated IOL (intraocular lens), posterior, left Slightly posterior dislocated and decentered intraocular lens now well centered in the sulcus  Secondary corneal edema, left  eye Condition resolved OS     ICD-10-CM   1. Cataract (lens) fragments in eye following cataract surgery, left eye  H59.022     2. Dislocated IOL (intraocular lens), posterior, left  T85.22XA     3. Secondary corneal edema, left eye  H18.232       1.  Okay to follow-up with Dr. Chalmers Guest at any time.  2.  Excellent visual acuity, today with 20/80 distance and likely improved near vision since patient has a pinhole acuity of 20/40 at 5 days postop  3.  Ophthalmic Meds Ordered this visit:  No orders of the defined types were placed in this encounter.      Return in about 4 weeks (around 08/29/2021) for dilate, OS, COLOR FP.  Patient Instructions  Ofloxacin  4 times daily to the operative eye  Lotemax SM 1 drop to the operative eye 4 times daily  Patient instructed not to refill the medications and use them for maximum of 3 weeks.  Patient instructed do not rub the eye.  Patient has the option to use the patch at night.    Explained the diagnoses, plan, and follow up with the patient and they expressed understanding.  Patient expressed understanding of the importance of proper follow up care.   Alford Highland Seaton Hofmann M.D. Diseases & Surgery of the Retina and Vitreous [facility] 08/01/21     Abbreviations: M myopia (nearsighted); A astigmatism; H hyperopia (farsighted); P presbyopia; Mrx spectacle prescription;  CTL contact lenses; OD right eye; OS left eye; OU both eyes  XT exotropia; ET esotropia; PEK punctate epithelial keratitis; PEE punctate epithelial erosions; DES dry eye syndrome; MGD meibomian gland dysfunction; ATs artificial tears; PFAT's preservative free artificial tears; NSC nuclear sclerotic cataract; PSC posterior subcapsular cataract; ERM epi-retinal membrane; PVD posterior vitreous detachment; RD retinal detachment; DM diabetes mellitus; DR diabetic retinopathy; NPDR non-proliferative diabetic retinopathy; PDR proliferative diabetic  retinopathy; CSME clinically significant macular edema; DME diabetic macular edema; dbh dot blot hemorrhages; CWS cotton wool spot; POAG  primary open angle glaucoma; C/D cup-to-disc ratio; HVF humphrey visual field; GVF goldmann visual field; OCT optical coherence tomography; IOP intraocular pressure; BRVO Branch retinal vein occlusion; CRVO central retinal vein occlusion; CRAO central retinal artery occlusion; BRAO branch retinal artery occlusion; RT retinal tear; SB scleral buckle; PPV pars plana vitrectomy; VH Vitreous hemorrhage; PRP panretinal laser photocoagulation; IVK intravitreal kenalog; VMT vitreomacular traction; MH Macular hole;  NVD neovascularization of the disc; NVE neovascularization elsewhere; AREDS age related eye disease study; ARMD age related macular degeneration; POAG primary open angle glaucoma; EBMD epithelial/anterior basement membrane dystrophy; ACIOL anterior chamber intraocular lens; IOL intraocular lens; PCIOL posterior chamber intraocular lens; Phaco/IOL phacoemulsification with intraocular lens placement; PRK photorefractive keratectomy; LASIK laser assisted in situ keratomileusis; HTN hypertension; DM diabetes mellitus; COPD chronic obstructive pulmonary disease

## 2021-08-01 NOTE — Assessment & Plan Note (Signed)
Condition resolved OS 

## 2021-08-29 ENCOUNTER — Encounter (INDEPENDENT_AMBULATORY_CARE_PROVIDER_SITE_OTHER): Payer: Self-pay | Admitting: Ophthalmology

## 2021-08-29 ENCOUNTER — Ambulatory Visit (INDEPENDENT_AMBULATORY_CARE_PROVIDER_SITE_OTHER): Payer: Medicare PPO | Admitting: Ophthalmology

## 2021-08-29 ENCOUNTER — Other Ambulatory Visit: Payer: Self-pay

## 2021-08-29 DIAGNOSIS — H59022 Cataract (lens) fragments in eye following cataract surgery, left eye: Secondary | ICD-10-CM | POA: Diagnosis not present

## 2021-08-29 DIAGNOSIS — T8522XA Displacement of intraocular lens, initial encounter: Secondary | ICD-10-CM

## 2021-08-29 DIAGNOSIS — H18232 Secondary corneal edema, left eye: Secondary | ICD-10-CM

## 2021-08-29 NOTE — Assessment & Plan Note (Signed)
Condition resolved OS 

## 2021-08-29 NOTE — Assessment & Plan Note (Signed)
Condition resolved OS post surgery

## 2021-08-29 NOTE — Patient Instructions (Addendum)
I instructed the patient to contact Dr. Earlean Polka office to schedule appointment sometime in 1 to 2 months   Patient to discontinue all topical medications related to the surgery  Patient to continue on Lumigan nightly

## 2021-08-29 NOTE — Progress Notes (Signed)
08/29/2021     CHIEF COMPLAINT Patient presents for  Chief Complaint  Patient presents with   Post-op Follow-up      HISTORY OF PRESENT ILLNESS: Lori Aguirre is a 76 y.o. female who presents to the clinic today for:   HPI     Post-op Follow-up           Laterality: left eye   Discomfort: Negative for pain, itching, foreign body sensation, tearing, discharge and floaters   Vision: is improved         Comments   4 week fu Dilate OS, color FP, post op sx 07/28/2021. Pt states "vision is clearing up, is much better than before."  Pt is using the lotemax OS QID, prednisolone OS QAM. Pt also uses Lumigan OU QHS.       Last edited by Nelva Nay on 08/29/2021  1:05 PM.      Referring physician: No referring provider defined for this encounter.  HISTORICAL INFORMATION:   Selected notes from the MEDICAL RECORD NUMBER       CURRENT MEDICATIONS: Current Outpatient Medications (Ophthalmic Drugs)  Medication Sig   loteprednol (LOTEMAX) 0.5 % ophthalmic suspension Place 1 drop into the left eye 4 (four) times daily.   No current facility-administered medications for this visit. (Ophthalmic Drugs)   No current outpatient medications on file. (Other)   No current facility-administered medications for this visit. (Other)      REVIEW OF SYSTEMS:    ALLERGIES No Known Allergies  PAST MEDICAL HISTORY Past Medical History:  Diagnosis Date   Diabetes mellitus without complication (HCC)    Glaucoma    Hypertension    Secondary corneal edema, left eye 07/10/2021   Postoperative change, from surgery 1 week ago, cataract extraction   Past Surgical History:  Procedure Laterality Date   CATARACT EXTRACTION Bilateral 07/03/2021   OD: 2018    FAMILY HISTORY History reviewed. No pertinent family history.  SOCIAL HISTORY Social History   Tobacco Use   Smoking status: Never   Smokeless tobacco: Never         OPHTHALMIC EXAM:  Base Eye Exam      Visual Acuity (ETDRS)       Right Left   Dist [place] 20/25 20/40 -2   Dist ph [place]  20/30         Tonometry (Tonopen, 1:07 PM)       Right Left   Pressure 15 22         Pupils       Dark Light Shape APD   Right 4 3  None   Left 5 5 Irregular None         Extraocular Movement       Right Left    Full Full         Neuro/Psych     Oriented x3: Yes   Mood/Affect: Normal         Dilation     Left eye: 1.0% Mydriacyl, 2.5% Phenylephrine @ 1:07 PM           Slit Lamp and Fundus Exam     External Exam       Right Left   External Normal Normal         Slit Lamp Exam       Right Left   Lids/Lashes  Normal   Conjunctiva/Sclera  Trace Injection   Cornea  Clear, well secured   Anterior Chamber  Deep and  quiet   Iris  peaked at 3 meridian   Lens Centered posterior chamber intraocular lens Centered posterior chamber intraocular lens, stable no pseudophacodonesis   Anterior Vitreous  Clear avitric         Fundus Exam       Right Left   Posterior Vitreous  Clear avitric   Disc  Normal, pink   C/D Ratio  0.25   Macula  Normal   Vessels  Normal   Periphery  Clear, no holes or tears            IMAGING AND PROCEDURES  Imaging and Procedures for 08/29/21  Color Fundus Photography Optos - OU - Both Eyes       Right Eye Progression has no prior data. Disc findings include normal observations. Macula : normal observations. Vessels : normal observations. Periphery : normal observations.   Left Eye Progression has improved. Disc findings include normal observations. Macula : normal observations. Vessels : normal observations. Periphery : normal observations.   Notes Clear Media OS, post vitrectomy             ASSESSMENT/PLAN:  Dislocated IOL (intraocular lens), posterior, left OS looks great status post vitrectomy, removal retained lens fragment and intraocular lens repositioning  Cataract (lens) fragments in eye following  cataract surgery, left eye Condition resolved OS post surgery  Secondary corneal edema, left eye Condition resolved OS     ICD-10-CM   1. Cataract (lens) fragments in eye following cataract surgery, left eye  H59.022 Color Fundus Photography Optos - OU - Both Eyes    2. Dislocated IOL (intraocular lens), posterior, left  T85.22XA     3. Secondary corneal edema, left eye  H18.232       1.  OS looks great today post vitrectomy removal retained lens fragments, repositioning of intraocular lens left eye.  Excellent acuity corneal edema has resolved  2.  Posterior segment is normal, OS  3.  Open-angle glaucoma follow-up with Dr. Marylynn Pearson as scheduled or in 1 to 2 months  Ophthalmic Meds Ordered this visit:  No orders of the defined types were placed in this encounter.      Return in about 4 months (around 12/27/2021) for DILATE OU, COLOR FP, OCT.  Patient Instructions  I instructed the patient to contact Dr. Earlean Polka office to schedule appointment sometime in 1 to 2 months   Patient to discontinue all topical medications related to the surgery  Patient to continue on Lumigan nightly   Explained the diagnoses, plan, and follow up with the patient and they expressed understanding.  Patient expressed understanding of the importance of proper follow up care.   Clent Demark Krystena Reitter M.D. Diseases & Surgery of the Retina and Vitreous [facility] 08/29/21     Abbreviations: M myopia (nearsighted); A astigmatism; H hyperopia (farsighted); P presbyopia; Mrx spectacle prescription;  CTL contact lenses; OD right eye; OS left eye; OU both eyes  XT exotropia; ET esotropia; PEK punctate epithelial keratitis; PEE punctate epithelial erosions; DES dry eye syndrome; MGD meibomian gland dysfunction; ATs artificial tears; PFAT's preservative free artificial tears; [place] nuclear sclerotic cataract; PSC posterior subcapsular cataract; ERM epi-retinal membrane; PVD posterior  vitreous detachment; RD retinal detachment; DM diabetes mellitus; DR diabetic retinopathy; NPDR non-proliferative diabetic retinopathy; PDR proliferative diabetic retinopathy; CSME clinically significant macular edema; DME diabetic macular edema; dbh dot blot hemorrhages; CWS cotton wool spot; POAG primary open angle glaucoma; C/D cup-to-disc ratio; HVF humphrey visual field; GVF goldmann  visual field; OCT optical coherence tomography; IOP intraocular pressure; BRVO Branch retinal vein occlusion; CRVO central retinal vein occlusion; CRAO central retinal artery occlusion; BRAO branch retinal artery occlusion; RT retinal tear; SB scleral buckle; PPV pars plana vitrectomy; VH Vitreous hemorrhage; PRP panretinal laser photocoagulation; IVK intravitreal kenalog; VMT vitreomacular traction; MH Macular hole;  NVD neovascularization of the disc; NVE neovascularization elsewhere; AREDS age related eye disease study; ARMD age related macular degeneration; POAG primary open angle glaucoma; EBMD epithelial/anterior basement membrane dystrophy; ACIOL anterior chamber intraocular lens; IOL intraocular lens; PCIOL posterior chamber intraocular lens; Phaco/IOL phacoemulsification with intraocular lens placement; [place] photorefractive keratectomy; LASIK laser assisted in situ keratomileusis; HTN hypertension; DM diabetes mellitus; COPD chronic obstructive pulmonary disease

## 2021-08-29 NOTE — Assessment & Plan Note (Signed)
OS looks great status post vitrectomy, removal retained lens fragment and intraocular lens repositioning

## 2021-12-26 ENCOUNTER — Encounter (INDEPENDENT_AMBULATORY_CARE_PROVIDER_SITE_OTHER): Payer: Self-pay | Admitting: Ophthalmology

## 2021-12-26 ENCOUNTER — Encounter (INDEPENDENT_AMBULATORY_CARE_PROVIDER_SITE_OTHER): Payer: Medicare PPO | Admitting: Ophthalmology

## 2021-12-26 ENCOUNTER — Ambulatory Visit (INDEPENDENT_AMBULATORY_CARE_PROVIDER_SITE_OTHER): Payer: Medicare PPO | Admitting: Ophthalmology

## 2021-12-26 DIAGNOSIS — H43312 Vitreous membranes and strands, left eye: Secondary | ICD-10-CM | POA: Diagnosis not present

## 2021-12-26 DIAGNOSIS — H401131 Primary open-angle glaucoma, bilateral, mild stage: Secondary | ICD-10-CM

## 2021-12-26 DIAGNOSIS — Z961 Presence of intraocular lens: Secondary | ICD-10-CM

## 2021-12-26 DIAGNOSIS — T8522XA Displacement of intraocular lens, initial encounter: Secondary | ICD-10-CM | POA: Diagnosis not present

## 2021-12-26 NOTE — Assessment & Plan Note (Signed)
OS now looks great with well centered posterior chamber intraocular lens. ? ?Excellent visual acuity regained. ? ?Follow-up Dr. Marylynn Pearson as scheduled ?

## 2021-12-26 NOTE — Assessment & Plan Note (Signed)
Post vitrectomy, posterior chamber intraocular lens in the sulcus placement, December 2022 looks great with improving acuity ?

## 2021-12-26 NOTE — Assessment & Plan Note (Signed)
Pressures well controlled as patient continues on Lumigan reportedly nightly ?

## 2021-12-26 NOTE — Progress Notes (Addendum)
? ? ?12/26/2021 ? ?  ? ?CHIEF COMPLAINT ?Patient presents for  ?Chief Complaint  ?Patient presents with  ? Retina Follow Up  ? ? ? ? ?HISTORY OF PRESENT ILLNESS: ?Lori Aguirre is a 76 y.o. female who presents to the clinic today for:  ? ?HPI   ? ? Retina Follow Up   ? ?      ? Diagnosis: Cataract (lens) fragments in eye following cataract surgery  ? Laterality: left eye  ? Onset: 4 months ago  ? Course: stable  ? ?  ?  ? ? Comments   ?4 mos fu OU oct fp. ?Pt reports using Lumigan QHS OU. ?Patient reports no issues since the surgery Dr. Luciana Axe did in January. ? ? ?  ?  ?Last edited by Nelva Nay on 12/26/2021  1:02 PM.  ?  ? ? ?Referring physician: ?Rinaldo Cloud, MD ?[address] ?[address] ?[place],  [place] [address] ? ?HISTORICAL INFORMATION:  ? ?Selected notes from the MEDICAL RECORD NUMBER ?  ?   ? ?CURRENT MEDICATIONS: ?Current Outpatient Medications (Ophthalmic Drugs)  ?Medication Sig  ? loteprednol (LOTEMAX) 0.5 % ophthalmic suspension Place 1 drop into the left eye 4 (four) times daily.  ? ?No current facility-administered medications for this visit. (Ophthalmic Drugs)  ? ?No current outpatient medications on file. (Other)  ? ?No current facility-administered medications for this visit. (Other)  ? ? ? ? ?REVIEW OF SYSTEMS: ?ROS   ?Negative for: Constitutional, Gastrointestinal, Neurological, Skin, Genitourinary, Musculoskeletal, HENT, Endocrine, Cardiovascular, Eyes, Respiratory, Psychiatric, Allergic/Imm, Heme/Lymph ?Last edited by Edmon Crape, MD on 12/26/2021  1:44 PM.  ?  ? ? ? ?ALLERGIES ?No Known Allergies ? ?PAST MEDICAL HISTORY ?Past Medical History:  ?Diagnosis Date  ? Diabetes mellitus without complication (HCC)   ? Glaucoma   ? Hypertension   ? 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 Postoperative change, from surgery 1 week ago, cataract extraction  ? ?Past Surgical History:  ?Procedure Laterality Date  ? CATARACT EXTRACTION Bilateral 07/03/2021  ? OD: 2018  ? ? ?FAMILY  HISTORY ?History reviewed. No pertinent family history. ? ?SOCIAL HISTORY ?Social History  ? ?Tobacco Use  ? Smoking status: Never  ? Smokeless tobacco: Never  ? ?  ? ?  ? ?OPHTHALMIC EXAM: ? ?Base Eye Exam   ? ? Visual Acuity (ETDRS)   ? ?   Right Left  ? Dist [place] 20/25 -2 20/25  ? ?  ?  ? ? Tonometry (Tonopen, 1:05 PM)   ? ?   Right Left  ? Pressure 11 12  ? ?  ?  ? ? Pupils   ? ?   Dark Light Shape React APD  ? Right 4 3 Round Brisk None  ? Left 5 5 Irregular  None  ? ?  ?  ? ? Visual Fields (Counting fingers)   ? ?   Left Right  ?  Full Full  ? ?  ?  ? ? Extraocular Movement   ? ?   Right Left  ?  Full Full  ? ?  ?  ? ? Neuro/Psych   ? ? Oriented x3: Yes  ? Mood/Affect: Normal  ? ?  ?  ? ? Dilation   ? ? Both eyes: 1.0% Mydriacyl, 2.5% Phenylephrine @ 1:05 PM  ? ?  ?  ? ?  ? ?Slit Lamp and Fundus Exam   ? ? External Exam   ? ?   Right  Left  ? External Normal Normal  ? ?  ?  ? ? Slit Lamp Exam   ? ?   Right Left  ? Lids/Lashes  Normal  ? Conjunctiva/Sclera  Trace Injection  ? Cornea  Clear, well secured  ? Anterior Chamber  Deep and quiet  ? Iris  peaked at 3 meridian, with small anterior synechiae.  ? Lens Centered posterior chamber intraocular lens Centered posterior chamber intraocular lens, stable no pseudophacodonesis  ? Anterior Vitreous  Clear avitric  ? ?  ?  ? ? Fundus Exam   ? ?   Right Left  ? Posterior Vitreous Posterior vitreous detachment Clear avitric  ? Disc Normal Normal, pink  ? C/D Ratio 0.1 0.25  ? Macula Normal Normal  ? Vessels Normal Normal  ? Periphery Normal Clear, no holes or tears  ? ?  ?  ? ?  ? ? ?IMAGING AND PROCEDURES  ?Imaging and Procedures for 12/26/21 ? ?OCT, Retina - OU - Both Eyes   ? ?   ?Right Eye ?Quality was good. Scan locations included subfoveal. Central Foveal Thickness: 274. Progression has been stable. Findings include normal foveal contour.  ? ?Left Eye ?Central Foveal Thickness: 316. Progression has been stable. Findings include normal foveal contour.  ? ?Notes ?OS  with cloudy media ? ? ? ? ?  ? ?Color Fundus Photography Optos - OU - Both Eyes   ? ?   ?Right Eye ?Progression has no prior data. Disc findings include normal observations. Macula : normal observations. Vessels : normal observations. Periphery : normal observations.  ? ?Left Eye ?Progression has improved. Disc findings include normal observations. Macula : normal observations. Vessels : normal observations. Periphery : normal observations.  ? ?Notes ?Clear Media OS, post vitrectomy ? ?  ? ? ?  ?  ? ?  ?ASSESSMENT/PLAN: ? ?Primary open angle glaucoma of both eyes, mild stage ?Pressures well controlled as patient continues on Lumigan reportedly nightly ? ?Pseudophakia, left eye ?Post vitrectomy, posterior chamber intraocular lens in the sulcus placement, December 2022 looks great with improving acuity ? ?Dislocated IOL (intraocular lens), posterior, left ?OS now looks great with well centered posterior chamber intraocular lens. ? ?Excellent visual acuity regained. ? ?Follow-up Dr. Chalmers Guestoy Whitaker as scheduled  ? ?  ICD-10-CM   ?1. Vitreous membranes and strands, left eye  H43.312 OCT, Retina - OU - Both Eyes  ?  Color Fundus Photography Optos - OU - Both Eyes  ?  ?2. Primary open angle glaucoma of both eyes, mild stage  H40.1131   ?  ?3. Pseudophakia, left eye  Z96.1   ?  ?4. Dislocated IOL (intraocular lens), posterior, left  T85.22XA   ?  ? ? ?1.  OS, vastly improved overall.  Excellent visual acuity ? ?2.  Glaucoma OU, under the care of Dr. Chalmers Guestoy Whitaker follow-up as scheduled ? ?3. ? ?Ophthalmic Meds Ordered this visit:  ?No orders of the defined types were placed in this encounter. ? ? ?  ? ?Return in about 6 months (around 06/28/2022) for DILATE OU, COLOR FP, OCT. ? ?There are no Patient Instructions on file for this visit. ? ? ?Explained the diagnoses, plan, and follow up with the patient and they expressed understanding.  Patient expressed understanding of the importance of proper follow up care.  ? ?Alford HighlandGary A.  Gizelle Whetsel M.D. ?Diseases & Surgery of the Retina and Vitreous ?[facility] ?12/26/21 ? ? ? ? ?Abbreviations: ?M myopia (nearsighted); A astigmatism; H  hyperopia (farsighted); P presbyopia; Mrx spectacle prescription;  CTL contact lenses; OD right eye; OS left eye; OU both eyes  XT exotropia; ET esotropia; PEK punctate epithelial keratitis; PEE punctate epithelial erosions; DES dry eye syndrome; MGD meibomian gland dysfunction; ATs artificial tears; PFAT's preservative free artificial tears; NSC nuclear sclerotic cataract; PSC posterior subcapsular cataract; ERM epi-retinal membrane; PVD posterior vitreous detachment; RD retinal detachment; DM diabetes mellitus; DR diabetic retinopathy; NPDR non-proliferative diabetic retinopathy; PDR proliferative diabetic retinopathy; CSME clinically significant macular edema; DME diabetic macular edema; dbh dot blot hemorrhages; CWS cotton wool spot; POAG primary open angle glaucoma; C/D cup-to-disc ratio; HVF humphrey visual field; GVF goldmann visual field; OCT optical coherence tomography; IOP intraocular pressure; BRVO Branch retinal vein occlusion; CRVO central retinal vein occlusion; CRAO central retinal artery occlusion; BRAO branch retinal artery occlusion; RT retinal tear; SB scleral buckle; PPV pars plana vitrectomy; VH Vitreous hemorrhage; PRP panretinal laser photocoagulation; IVK intravitreal kenalog; VMT vitreomacular traction; MH Macular hole;  NVD neovascularization of the disc; NVE neovascularization elsewhere; AREDS age related eye disease study; ARMD age related macular degeneration; POAG primary open angle glaucoma; EBMD epithelial/anterior basement membrane dystrophy; ACIOL anterior chamber intraocular lens; IOL intraocular lens; PCIOL posterior chamber intraocular lens; Phaco/IOL phacoemulsification with intraocular lens placement; PRK photorefractive keratectomy; LASIK laser assisted in situ keratomileusis; HTN hypertension; DM diabetes  mellitus; COPD chronic obstructive pulmonary disease ?

## 2022-07-03 ENCOUNTER — Encounter (INDEPENDENT_AMBULATORY_CARE_PROVIDER_SITE_OTHER): Payer: Medicare PPO | Admitting: Ophthalmology

## 2023-10-19 DEATH — deceased
